# Patient Record
Sex: Female | Born: 1955 | Race: White | Hispanic: No | Marital: Married | Smoking: Current every day smoker
Health system: Southern US, Community
[De-identification: ages and names within clinical notes are randomized; demographics above are authoritative.]

## PROBLEM LIST (undated history)

## (undated) DIAGNOSIS — J310 Chronic rhinitis: Secondary | ICD-10-CM

## (undated) DIAGNOSIS — D649 Anemia, unspecified: Secondary | ICD-10-CM

## (undated) DIAGNOSIS — R011 Cardiac murmur, unspecified: Secondary | ICD-10-CM

## (undated) DIAGNOSIS — R519 Headache, unspecified: Secondary | ICD-10-CM

## (undated) DIAGNOSIS — K7460 Unspecified cirrhosis of liver: Secondary | ICD-10-CM

## (undated) DIAGNOSIS — M4802 Spinal stenosis, cervical region: Secondary | ICD-10-CM

## (undated) DIAGNOSIS — I10 Essential (primary) hypertension: Secondary | ICD-10-CM

## (undated) HISTORY — PX: CATARACT EXTRACTION: SUR2

## (undated) HISTORY — PX: TUBAL LIGATION: SHX77

## (undated) HISTORY — PX: LIVER BIOPSY: SHX301

## (undated) HISTORY — DX: Essential (primary) hypertension: I10

## (undated) HISTORY — DX: Chronic rhinitis: J31.0

---

## 1999-02-22 ENCOUNTER — Emergency Department (HOSPITAL_COMMUNITY): Admission: EM | Admit: 1999-02-22 | Discharge: 1999-02-22 | Payer: Self-pay | Admitting: Emergency Medicine

## 2004-06-26 ENCOUNTER — Ambulatory Visit: Payer: Self-pay | Admitting: Neurosurgery

## 2005-08-05 ENCOUNTER — Emergency Department: Payer: Self-pay | Admitting: Emergency Medicine

## 2007-01-13 ENCOUNTER — Other Ambulatory Visit: Payer: Self-pay

## 2007-01-13 ENCOUNTER — Emergency Department: Payer: Self-pay | Admitting: Emergency Medicine

## 2007-07-19 ENCOUNTER — Emergency Department: Payer: Self-pay | Admitting: Internal Medicine

## 2016-02-01 ENCOUNTER — Emergency Department: Payer: Medicare Other

## 2016-02-01 ENCOUNTER — Emergency Department
Admission: EM | Admit: 2016-02-01 | Discharge: 2016-02-01 | Disposition: A | Discharge status: Home / Self Care | Payer: Medicare Other | Attending: Emergency Medicine | Admitting: Emergency Medicine

## 2016-02-01 ENCOUNTER — Encounter: Payer: Self-pay | Admitting: Emergency Medicine

## 2016-02-01 DIAGNOSIS — J0100 Acute maxillary sinusitis, unspecified: Secondary | ICD-10-CM | POA: Insufficient documentation

## 2016-02-01 DIAGNOSIS — F17210 Nicotine dependence, cigarettes, uncomplicated: Secondary | ICD-10-CM | POA: Diagnosis not present

## 2016-02-01 DIAGNOSIS — R0981 Nasal congestion: Secondary | ICD-10-CM | POA: Diagnosis present

## 2016-02-01 HISTORY — DX: Spinal stenosis, cervical region: M48.02

## 2016-02-01 MED ORDER — BENZONATATE 100 MG PO CAPS: 100.0000 mg | ORAL_CAPSULE | Freq: Three times a day (TID) | ORAL | Status: DC | PRN

## 2016-02-01 MED ORDER — IPRATROPIUM-ALBUTEROL 0.5-2.5 (3) MG/3ML IN SOLN
3.0000 mL | Freq: Once | RESPIRATORY_TRACT | Status: DC
  Filled 2016-02-01: qty 3

## 2016-02-01 MED ORDER — AMOXICILLIN 500 MG PO CAPS: 500.0000 mg | ORAL_CAPSULE | Freq: Three times a day (TID) | ORAL | Status: DC

## 2016-02-01 MED ORDER — AMOXICILLIN 500 MG PO CAPS
500.0000 mg | ORAL_CAPSULE | Freq: Once | ORAL | Status: AC
  Administered 2016-02-01: 500 mg via ORAL
  Filled 2016-02-01: qty 1

## 2016-02-01 MED ORDER — FLUTICASONE PROPIONATE 50 MCG/ACT NA SUSP: 2.0000 | Freq: Every day | NASAL | Status: DC

## 2016-02-01 MED ORDER — ALBUTEROL SULFATE HFA 108 (90 BASE) MCG/ACT IN AERS: 2.0000 | INHALATION_SPRAY | Freq: Four times a day (QID) | RESPIRATORY_TRACT | Status: DC | PRN

## 2016-02-01 NOTE — Discharge Instructions (Signed)

## 2016-02-01 NOTE — ED Notes (Signed)
Patient with cough and congestion for 2 weeks. Nasal drainage is yellow/green and sputum is yellow per patient.

## 2016-02-01 NOTE — ED Notes (Signed)
Discharge instructions reviewed with patient. Patient verbalized understanding. Patient ambulated to lobby without difficulty.   

## 2016-02-01 NOTE — ED Provider Notes (Signed)
[facility] Emergency Department Provider Note ____________________________________________  Time seen: 2209  I have reviewed the triage vital signs and the nursing notes.  HISTORY  Chief Complaint  Nasal Congestion  HPI Monica Johnson is a 60 y.o. female presents to the ED for evaluation of cough and nasal congestion for the last 2 weeks. Patient describes symptoms initially with clear nasal drainage that quickly progressed to purulent nasal drainage and stuffy nose. She also developed some chest congestion intermittently productive cough. She also reports of ectopy was in the interim. She has been treating her symptoms with a daily sinus wash as well as sporadic doses of NyQuil. She denies any significant headache, nausea, vomiting, or vertigo.  Past Medical History  Diagnosis Date  . Cervical stenosis of spine     There are no active problems to display for this patient.   Past Surgical History  Procedure Laterality Date  . Eye surgery      Current Outpatient Rx  Name  Route  Sig  Dispense  Refill  . albuterol (PROVENTIL HFA;VENTOLIN HFA) 108 (90 Base) MCG/ACT inhaler   Inhalation   Inhale 2 puffs into the lungs every 6 (six) hours as needed for wheezing or shortness of breath.   1 Inhaler   0   . amoxicillin (AMOXIL) 500 MG capsule   Oral   Take 1 capsule (500 mg total) by mouth 3 (three) times daily.   30 capsule   0   . benzonatate (TESSALON PERLES) 100 MG capsule   Oral   Take 1 capsule (100 mg total) by mouth 3 (three) times daily as needed for cough (Take 1-2 per dose).   30 capsule   0   . fluticasone (FLONASE) 50 MCG/ACT nasal spray   Each Nare   Place 2 sprays into both nostrils daily.   16 g   0     Allergies Aspirin and Sulfa antibiotics  No family history on file.  Social History Social History  Substance Use Topics  . Smoking status: Current Every Day Smoker -- 1.00 packs/day    Types: Cigarettes  . Smokeless  tobacco: Never Used  . Alcohol Use: No   Review of Systems  Constitutional: Positive for subjective fever. Eyes: Negative for visual changes. ENT: Negative for sore throat. Report purulent nasal drainage and congestion as above.  Cardiovascular: Negative for chest pain. Respiratory: Negative for shortness of breath. Reports intermittently productive cough.  Neurological: Negative for headaches, focal weakness or numbness. ____________________________________________  PHYSICAL EXAM:  VITAL SIGNS: ED Triage Vitals  Enc Vitals Group     BP 02/01/16 2101 126/56 mmHg     Pulse Rate 02/01/16 2101 85     Resp 02/01/16 2101 18     Temp 02/01/16 2101 98.1 F (36.7 C)     Temp Source 02/01/16 2101 Oral     SpO2 02/01/16 2101 96 %     Weight 02/01/16 2101 150 lb (68.04 kg)     Height 02/01/16 2101 5\' 3"  (1.6 m)     Head Cir --      Peak Flow --      Pain Score --      Pain Loc --      Pain Edu? --      Excl. in [place]? --    Constitutional: Alert and oriented. Well appearing and in no distress. Head: Normocephalic and atraumatic.      Eyes: Conjunctivae are normal. PERRL. Normal extraocular movements  Ears: Canals clear. TMs intact bilaterally.   Nose: No congestion/rhinorrhea.   Mouth/Throat: Mucous membranes are moist.   Neck: Supple. No thyromegaly. Hematological/Lymphatic/Immunological: No cervical lymphadenopathy. Cardiovascular: Normal rate, regular rhythm.  Respiratory: Normal respiratory effort. No wheezes/rales/rhonchi. Gastrointestinal: Soft and nontender. No distention. Musculoskeletal: Nontender with normal range of motion in all extremities.  Neurologic:  Normal gait without ataxia. Normal speech and language. No gross focal neurologic deficits are appreciated. Skin:  Skin is warm, dry and intact. No rash noted. ____________________________________________   RADIOLOGY  CXR IMPRESSION: No active cardiopulmonary disease.  I, Karlei Waldo, Dannielle Karvonen,  personally viewed and evaluated these images (plain radiographs) as part of my medical decision making, as well as reviewing the written report by the radiologist. ____________________________________________  PROCEDURES  Amoxicillin 875 mg PO ____________________________________________  INITIAL IMPRESSION / ASSESSMENT AND PLAN / ED COURSE  Patient with evidence consistent with an acute maxillary sinusitis. She'll be discharged with a prescription for amoxicillin 500 mg, Tessalon Perles, and instructions on over-the-counter medicine management including continued nasal rinses, antihistamine, and decongestant as needed. She will follow with her primary care provider for ongoing symptom management. ____________________________________________  FINAL CLINICAL IMPRESSION(S) / ED DIAGNOSES  Final diagnoses:  Acute maxillary sinusitis, recurrence not specified     Melvenia Needles, PA-C 02/01/16 2255  Harvest Dark, MD 02/01/16 2317

## 2016-02-01 NOTE — ED Notes (Signed)
Pt presents to ED with nasal congestion since yesterday. Fatigue, cough, and intermittent fever at home.

## 2016-11-19 ENCOUNTER — Emergency Department
Admission: EM | Admit: 2016-11-19 | Discharge: 2016-11-19 | Disposition: A | Discharge status: Home / Self Care | Payer: Medicare Other | Attending: Emergency Medicine | Admitting: Emergency Medicine

## 2016-11-19 ENCOUNTER — Emergency Department: Payer: Medicare Other

## 2016-11-19 DIAGNOSIS — R109 Unspecified abdominal pain: Secondary | ICD-10-CM | POA: Diagnosis present

## 2016-11-19 DIAGNOSIS — N12 Tubulo-interstitial nephritis, not specified as acute or chronic: Secondary | ICD-10-CM | POA: Insufficient documentation

## 2016-11-19 DIAGNOSIS — F17210 Nicotine dependence, cigarettes, uncomplicated: Secondary | ICD-10-CM | POA: Diagnosis not present

## 2016-11-19 DIAGNOSIS — R300 Dysuria: Secondary | ICD-10-CM | POA: Diagnosis not present

## 2016-11-19 LAB — URINALYSIS, ROUTINE W REFLEX MICROSCOPIC
Bilirubin Urine: NEGATIVE
Glucose, UA: NEGATIVE mg/dL
Ketones, ur: NEGATIVE mg/dL
Nitrite: POSITIVE — AB
Protein, ur: NEGATIVE mg/dL
Specific Gravity, Urine: 1.025 (ref 1.005–1.030)
pH: 5.5 (ref 5.0–8.0)

## 2016-11-19 LAB — URINALYSIS, MICROSCOPIC (REFLEX)

## 2016-11-19 MED ORDER — NAPROXEN 500 MG PO TBEC: 500.0000 mg | DELAYED_RELEASE_TABLET | Freq: Two times a day (BID) | ORAL | 0 refills | Status: AC

## 2016-11-19 MED ORDER — KETOROLAC TROMETHAMINE 30 MG/ML IJ SOLN
30.0000 mg | Freq: Once | INTRAMUSCULAR | Status: AC
  Administered 2016-11-19: 30 mg via INTRAMUSCULAR
  Filled 2016-11-19: qty 1

## 2016-11-19 MED ORDER — LEVOFLOXACIN 750 MG PO TABS: 750.0000 mg | ORAL_TABLET | Freq: Every day | ORAL | 0 refills | Status: AC

## 2016-11-19 MED ORDER — LEVOFLOXACIN 500 MG PO TABS
750.0000 mg | ORAL_TABLET | Freq: Once | ORAL | Status: AC
  Administered 2016-11-19: 750 mg via ORAL
  Filled 2016-11-19: qty 1

## 2016-11-19 NOTE — ED Notes (Signed)
See triage note.  Developed bilateral flank pain for 3 days   Also had fever couple of nights ago.  Afebrile on arrival

## 2016-11-19 NOTE — ED Triage Notes (Signed)
Pt reports dysuria and urinary frequency for 3 days.  Pt also has low back pain.  Pt alert.

## 2016-11-19 NOTE — ED Provider Notes (Signed)
[facility] Emergency Department Provider Note  ____________________________________________  Time seen: Approximately 9:35 PM  I have reviewed the triage vital signs and the nursing notes.   HISTORY  Chief Complaint Dysuria    HPI Monica Johnson is a 61 y.o. female presenting to the emergency department with increased urinary frequency, dysuria, hematuria and bilateral flank pain for the past 3 days. Patient states that she has had fever through the night. However, she has not evaluated her temperature. Patient states that she has been diagnosed with cystitis in the past. Patient states that she has been drinking cranberry juice and using apple cider vinegar, which has not relieved her symptoms. Patient states that she is currently homeless and living in her car after her husband abandoned her. She is not currently working and is accompanied by her dog. Patient denies nausea, vomiting or abdominal pain. Patient has never been diagnosed with a kidney stone in the past.   Past Medical History:  Diagnosis Date  . Cervical stenosis of spine     There are no active problems to display for this patient.   Past Surgical History:  Procedure Laterality Date  . EYE SURGERY      Prior to Admission medications   Medication Sig Start Date End Date Taking? Authorizing Provider  albuterol (PROVENTIL HFA;VENTOLIN HFA) 108 (90 Base) MCG/ACT inhaler Inhale 2 puffs into the lungs every 6 (six) hours as needed for wheezing or shortness of breath. 02/01/16   Jenise V Bacon Menshew, PA-C  amoxicillin (AMOXIL) 500 MG capsule Take 1 capsule (500 mg total) by mouth 3 (three) times daily. 02/01/16   Jenise V Bacon Menshew, PA-C  benzonatate (TESSALON PERLES) 100 MG capsule Take 1 capsule (100 mg total) by mouth 3 (three) times daily as needed for cough (Take 1-2 per dose). 02/01/16   Jenise V Bacon Menshew, PA-C  fluticasone (FLONASE) 50 MCG/ACT nasal spray Place 2 sprays into both  nostrils daily. 02/01/16   Jenise V Bacon Menshew, PA-C  levofloxacin (LEVAQUIN) 750 MG tablet Take 1 tablet (750 mg total) by mouth daily. 11/19/16 11/25/16  Lannie Fields, PA-C  naproxen (EC NAPROSYN) 500 MG EC tablet Take 1 tablet (500 mg total) by mouth 2 (two) times daily with a meal. 11/19/16 11/29/16  Lannie Fields, PA-C    Allergies Aspirin and Sulfa antibiotics  No family history on file.  Social History Social History  Substance Use Topics  . Smoking status: Current Every Day Smoker    Packs/day: 1.00    Types: Cigarettes  . Smokeless tobacco: Never Used  . Alcohol use No     Review of Systems  Constitutional: Patient has had fever.  Eyes: No visual changes. No discharge ENT: No upper respiratory complaints. Cardiovascular: no chest pain. Respiratory: no cough. No SOB. Gastrointestinal: No abdominal pain.  No nausea, no vomiting.  No diarrhea.  No constipation. Genitourinary: Patient has dysuria, increased urinary frequency and hematuria. Patient has bilateral flank pain. Musculoskeletal: Negative for musculoskeletal pain. Skin: Negative for rash, abrasions, lacerations, ecchymosis. Neurological: Negative for headaches, focal weakness or numbness.   ____________________________________________   PHYSICAL EXAM:  VITAL SIGNS: ED Triage Vitals  Enc Vitals Group     BP 11/19/16 1819 (!) 155/64     Pulse Rate 11/19/16 1819 87     Resp 11/19/16 1819 20     Temp 11/19/16 1819 98 F (36.7 C)     Temp Source 11/19/16 1819 Oral     SpO2 11/19/16  1819 99 %     Weight 11/19/16 1821 150 lb (68 kg)     Height 11/19/16 1821 5\' 3"  (1.6 m)     Head Circumference --      Peak Flow --      Pain Score 11/19/16 1819 8     Pain Loc --      Pain Edu? --      Excl. in [place]? --     Constitutional: Alert and oriented. Well appearing and in no acute distress. Eyes: Conjunctivae are normal. PERRL. EOMI. Head: Atraumatic. Cardiovascular: Normal rate, regular rhythm. Normal S1  and S2.  Good peripheral circulation. Respiratory: Normal respiratory effort without tachypnea or retractions. Lungs CTAB. Good air entry to the bases with no decreased or absent breath sounds. Gastrointestinal: Bowel sounds 4 quadrants. Patient has suprapubic pain. No guarding or rigidity. No palpable masses. No distention. Patient has bilateral CVA tenderness.  Musculoskeletal: Full range of motion to all extremities. No gross deformities appreciated. Neurologic:  Normal speech and language. No gross focal neurologic deficits are appreciated.  Skin:  Skin is warm, dry and intact. No rash noted. Psychiatric: Mood and affect are normal. Speech and behavior are normal. Patient exhibits appropriate insight and judgement.   ____________________________________________   LABS (all labs ordered are listed, but only abnormal results are displayed)  Labs Reviewed  URINALYSIS, ROUTINE W REFLEX MICROSCOPIC - Abnormal; Notable for the following:       Result Value   APPearance CLOUDY (*)    Hgb urine dipstick LARGE (*)    Nitrite POSITIVE (*)    Leukocytes, UA LARGE (*)    All other components within normal limits  URINALYSIS, MICROSCOPIC (REFLEX) - Abnormal; Notable for the following:    Bacteria, UA MANY (*)    Squamous Epithelial / LPF 0-5 (*)    All other components within normal limits   ____________________________________________  EKG   ____________________________________________  RADIOLOGY Unk Pinto, personally viewed and evaluated these images (plain radiographs) as part of my medical decision making, as well as reviewing the written report by the radiologist.  Ct Renal Stone Study  Result Date: 11/19/2016 CLINICAL DATA:  Dysuria and urinary frequency EXAM: CT ABDOMEN AND PELVIS WITHOUT CONTRAST TECHNIQUE: Multidetector CT imaging of the abdomen and pelvis was performed following the standard protocol without IV contrast. COMPARISON:  None. FINDINGS: Lower chest: Lung  bases demonstrate no acute consolidation or pleural effusion. The heart is nonenlarged. Hepatobiliary: No focal hepatic abnormality. No calcified gallstones. No biliary dilatation. Pancreas: Unremarkable. No pancreatic ductal dilatation or surrounding inflammatory changes. Spleen: Normal in size without focal abnormality. Adrenals/Urinary Tract: Adrenal glands are within normal limits. No calcified stones in the kidneys. Mild perinephric fat stranding right greater than left with fat stranding around the renal pelvises. Both ureters are prominent however no calcified stones are seen along the course of the ureters. The bladder is thick-walled and irregular. Stomach/Bowel: Stomach is within normal limits. Appendix appears normal. No evidence of bowel wall thickening, distention, or inflammatory changes. Sigmoid colon diverticula. No acute inflammation Vascular/Lymphatic: Aortic atherosclerosis. Subcentimeter periesophageal lymph nodes. Small lymph nodes in the central mesentery. Reproductive: Uterus and bilateral adnexa are unremarkable. Other: No free air or free fluid. Mild hazy density within the central mesentery. Musculoskeletal: Degenerative changes. No acute or suspicious bone lesion IMPRESSION: 1. No hydronephrosis or ureteral stones are visualized. Irregular thick-walled appearance of the bladder suspicious for cystitis. There is mild perinephric fat stranding and mild inflammation around the renal  pelvises, findings could be secondary to pyelonephritis in the appropriate clinical setting. 2. Mild hazy infiltration of the central mesentery with several subcentimeter lymph nodes, could relate to nonspecific mesenteric inflammation or panniculitis Electronically Signed   By: Donavan Foil M.D.   On: 11/19/2016 20:38    ____________________________________________    PROCEDURES  Procedure(s) performed:    Procedures    Medications  levofloxacin (LEVAQUIN) tablet 750 mg (750 mg Oral Given  11/19/16 2049)  ketorolac (TORADOL) 30 MG/ML injection 30 mg (30 mg Intramuscular Given 11/19/16 2049)     ____________________________________________   INITIAL IMPRESSION / ASSESSMENT AND PLAN / ED COURSE  Pertinent labs & imaging results that were available during my care of the patient were reviewed by me and considered in my medical decision making (see chart for details).  Review of the [place] CSRS was performed in accordance of the [place] prior to dispensing any controlled drugs.     Assessment and plan: Pyelonephritis:  Patient presents to the emergency department with bilateral flank pain, increased urinary frequency, dysuria and hematuria for the past 3 days. CT renal stone study is consistent with pyelonephritis. Patient was given Levaquin in the emergency department. She was discharged with Levaquin. A follow-up referral was made to urology. Vital signs are reassuring at this time. All patient questions were answered.  ____________________________________________  FINAL CLINICAL IMPRESSION(S) / ED DIAGNOSES  Final diagnoses:  Pyelonephritis      NEW MEDICATIONS STARTED DURING THIS VISIT:  Discharge Medication List as of 11/19/2016  8:47 PM    START taking these medications   Details  levofloxacin (LEVAQUIN) 750 MG tablet Take 1 tablet (750 mg total) by mouth daily., Starting Mon 11/19/2016, Until Sun 11/25/2016, Print            This chart was dictated using voice recognition software/Dragon. Despite best efforts to proofread, errors can occur which can change the meaning. Any change was purely unintentional.    Lannie Fields, PA-C 11/19/16 2143    Nena Polio, MD 11/19/16 [phone]

## 2018-08-11 ENCOUNTER — Other Ambulatory Visit: Payer: Self-pay

## 2018-08-11 ENCOUNTER — Encounter: Payer: Self-pay | Admitting: Emergency Medicine

## 2018-08-11 ENCOUNTER — Emergency Department
Admission: EM | Admit: 2018-08-11 | Discharge: 2018-08-11 | Disposition: A | Discharge status: Home / Self Care | Payer: Medicare Other | Attending: Emergency Medicine | Admitting: Emergency Medicine

## 2018-08-11 DIAGNOSIS — M62838 Other muscle spasm: Secondary | ICD-10-CM | POA: Diagnosis not present

## 2018-08-11 DIAGNOSIS — F17210 Nicotine dependence, cigarettes, uncomplicated: Secondary | ICD-10-CM | POA: Insufficient documentation

## 2018-08-11 DIAGNOSIS — M436 Torticollis: Secondary | ICD-10-CM | POA: Diagnosis not present

## 2018-08-11 DIAGNOSIS — J039 Acute tonsillitis, unspecified: Secondary | ICD-10-CM | POA: Diagnosis not present

## 2018-08-11 DIAGNOSIS — M542 Cervicalgia: Secondary | ICD-10-CM | POA: Diagnosis present

## 2018-08-11 MED ORDER — HYDROCODONE-ACETAMINOPHEN 5-325 MG PO TABS
1.0000 | ORAL_TABLET | Freq: Once | ORAL | Status: AC
  Administered 2018-08-11: 1 via ORAL
  Filled 2018-08-11: qty 1

## 2018-08-11 MED ORDER — DEXAMETHASONE SODIUM PHOSPHATE 10 MG/ML IJ SOLN
10.0000 mg | Freq: Once | INTRAMUSCULAR | Status: AC
  Administered 2018-08-11: 10 mg via INTRAMUSCULAR
  Filled 2018-08-11: qty 1

## 2018-08-11 MED ORDER — METHOCARBAMOL 500 MG PO TABS: 500.0000 mg | ORAL_TABLET | Freq: Four times a day (QID) | ORAL | 0 refills | Status: DC | PRN

## 2018-08-11 MED ORDER — PREDNISONE 10 MG PO TABS: ORAL_TABLET | ORAL | 0 refills | Status: DC

## 2018-08-11 MED ORDER — METHOCARBAMOL 500 MG PO TABS
1000.0000 mg | ORAL_TABLET | Freq: Once | ORAL | Status: AC
  Administered 2018-08-11: 1000 mg via ORAL
  Filled 2018-08-11: qty 2

## 2018-08-11 MED ORDER — HYDROCODONE-ACETAMINOPHEN 5-325 MG PO TABS: 1.0000 | ORAL_TABLET | Freq: Four times a day (QID) | ORAL | 0 refills | Status: DC | PRN

## 2018-08-11 NOTE — Discharge Instructions (Addendum)
Follow-up with your primary care provider or the doctor that you see for neck problems.  You may also follow-up with [facility] acute care if any continued problems.  Your medicine was sent to your pharmacy in [place] taking them only as directed.  You may also use ice or heat to your neck as needed for discomfort.

## 2018-08-11 NOTE — ED Notes (Signed)
Reference triage note. Pt states "I have 5 messed up vertebra in my back". Pt has hx of muscle spasms in neck. Pt appears to be uncomfortable while laying in bed. Pt given pillow at this time.

## 2018-08-11 NOTE — ED Provider Notes (Signed)
[facility] Emergency Department Provider Note  ____________________________________________   First MD Initiated Contact with Patient 08/11/18 1315     (approximate)  I have reviewed the triage vital signs and the nursing notes.   HISTORY  Chief Complaint Neck Pain and Spasms   HPI Monica Johnson is a 63 y.o. female presents to the ED with complaint of chronic cervical pain with acute exacerbation.  Patient denies any recent injury.  She states that she has had muscle spasms since 07/27/2018 with some radiation down her left arm.  She has been using a neck pillow for support.  She states she is already had 3 MRIs and currently is seeing a Publishing rights manager.  Currently she rates her pain as an 8 out of 10.   Past Medical History:  Diagnosis Date  . Cervical stenosis of spine     There are no active problems to display for this patient.   Past Surgical History:  Procedure Laterality Date  . EYE SURGERY      Prior to Admission medications   Medication Sig Start Date End Date Taking? Authorizing Provider  albuterol (PROVENTIL HFA;VENTOLIN HFA) 108 (90 Base) MCG/ACT inhaler Inhale 2 puffs into the lungs every 6 (six) hours as needed for wheezing or shortness of breath. 02/01/16   Menshew, Dannielle Karvonen, PA-C  amoxicillin (AMOXIL) 500 MG capsule Take 1 capsule (500 mg total) by mouth 3 (three) times daily. 02/01/16   Menshew, Dannielle Karvonen, PA-C  benzonatate (TESSALON PERLES) 100 MG capsule Take 1 capsule (100 mg total) by mouth 3 (three) times daily as needed for cough (Take 1-2 per dose). 02/01/16   Menshew, Dannielle Karvonen, PA-C  fluticasone (FLONASE) 50 MCG/ACT nasal spray Place 2 sprays into both nostrils daily. 02/01/16   Menshew, Dannielle Karvonen, PA-C  HYDROcodone-acetaminophen (NORCO/VICODIN) 5-325 MG tablet Take 1 tablet by mouth every 6 (six) hours as needed for moderate pain. 08/11/18   Johnn Hai, PA-C  methocarbamol (ROBAXIN) 500 MG tablet Take 1  tablet (500 mg total) by mouth every 6 (six) hours as needed. 08/11/18   Johnn Hai, PA-C  predniSONE (DELTASONE) 10 MG tablet Take 6 tablets  today, on day 2 take 5 tablets, day 3 take 4 tablets, day 4 take 3 tablets, day 5 take  2 tablets and 1 tablet the last day 08/11/18   Johnn Hai, PA-C    Allergies Aspirin and Sulfa antibiotics  No family history on file.  Social History Social History   Tobacco Use  . Smoking status: Current Every Day Smoker    Packs/day: 1.00    Types: Cigarettes  . Smokeless tobacco: Never Used  Substance Use Topics  . Alcohol use: No  . Drug use: No    Review of Systems Constitutional: No fever/chills Eyes: No visual changes. Cardiovascular: Denies chest pain. Respiratory: Denies shortness of breath. Musculoskeletal: Positive for chronic and acute cervical pain. Skin: Negative for rash. Neurological: Negative for headaches ___________________________________________   PHYSICAL EXAM:  VITAL SIGNS: ED Triage Vitals  Enc Vitals Group     BP 08/11/18 1253 (!) 145/78     Pulse Rate 08/11/18 1253 82     Resp 08/11/18 1253 18     Temp 08/11/18 1254 98 F (36.7 C)     Temp Source 08/11/18 1253 Oral     SpO2 08/11/18 1253 98 %     Weight 08/11/18 1254 185 lb (83.9 kg)     Height 08/11/18  1254 5\' 3"  (1.6 m)     Head Circumference --      Peak Flow --      Pain Score 08/11/18 1254 8     Pain Loc --      Pain Edu? --      Excl. in [place]? --    Constitutional: Alert and oriented. Well appearing and in no acute distress. Eyes: Conjunctivae are normal. PERRL. EOMI. Head: Atraumatic. Neck: No stridor.  No point tenderness on palpation of cervical spine posteriorly.  There is moderate soft tissue tenderness on palpation bilateral trapezius muscles and cervical muscles.  Range of motion is restricted x4 planes secondary to discomfort.  No evidence of injury on skin exam.   Cardiovascular: Normal rate, regular rhythm. Grossly normal heart  sounds.  Good peripheral circulation. Respiratory: Normal respiratory effort.  No retractions. Lungs CTAB. Neurologic:  Normal speech and language. No gross focal neurologic deficits are appreciated.  Skin:  Skin is warm, dry and intact. No rash noted. Psychiatric: Mood and affect are normal. Speech and behavior are normal.  ____________________________________________   LABS (all labs ordered are listed, but only abnormal results are displayed)  Labs Reviewed - No data to display   PROCEDURES  Procedure(s) performed: None  Procedures  Critical Care performed: No  ____________________________________________   INITIAL IMPRESSION / ASSESSMENT AND PLAN / ED COURSE  As part of my medical decision making, I reviewed the following data within the electronic MEDICAL RECORD NUMBER Notes from prior ED visits and [place] Controlled Substance Database  Patient presents to the ED with complaint of muscle spasms in her neck since 12/22-23 without history of injury.  Patient has an extensive history of neck problems and states that she has had 3 MRIs and is currently seeing a neurosurgeon.  She has been using a neck wrist pillow for added support.  Neurologically she is intact.  Patient was given Decadron 10 mg IM, Norco, and methocarbamol 1000 mg p.o. while in the ED.  She was improved prior to discharge.  She will continue medications as prescribed and make an appointment to follow-up with her neurosurgeon as soon as possible.  He was encouraged to use ice or heat to her muscles as needed for discomfort. ____________________________________________   FINAL CLINICAL IMPRESSION(S) / ED DIAGNOSES  Final diagnoses:  Torticollis, acute     ED Discharge Orders         Ordered    HYDROcodone-acetaminophen (NORCO/VICODIN) 5-325 MG tablet  Every 6 hours PRN     08/11/18 1444    predniSONE (DELTASONE) 10 MG tablet     08/11/18 1444    methocarbamol (ROBAXIN) 500 MG tablet  Every 6 hours PRN      08/11/18 1444           Note:  This document was prepared using Dragon voice recognition software and may include unintentional dictation errors.    Johnn Hai, PA-C 08/11/18 1515    Carrie Mew, MD 08/18/18 0006

## 2018-08-11 NOTE — ED Triage Notes (Signed)
2-3 days before christmas, pt states her neck began hurting, spasms down her left arm, presents with soft neck pillow intact at triage, pt states pain got too intense before she could catch it. NAD.

## 2018-11-03 ENCOUNTER — Telehealth: Payer: Self-pay

## 2018-11-03 NOTE — Telephone Encounter (Signed)
E visit scheduled. 

## 2018-11-03 NOTE — Telephone Encounter (Signed)
Patient reports she will do e-visit. Please schedule on Dr. Sharmaine Base calender and send her a link.

## 2018-11-10 ENCOUNTER — Encounter: Payer: Self-pay | Admitting: Family Medicine

## 2018-11-10 ENCOUNTER — Ambulatory Visit (INDEPENDENT_AMBULATORY_CARE_PROVIDER_SITE_OTHER): Payer: Medicare Other | Admitting: Family Medicine

## 2018-11-10 DIAGNOSIS — M4802 Spinal stenosis, cervical region: Secondary | ICD-10-CM

## 2018-11-10 DIAGNOSIS — F17210 Nicotine dependence, cigarettes, uncomplicated: Secondary | ICD-10-CM | POA: Diagnosis not present

## 2018-11-10 DIAGNOSIS — M545 Low back pain: Secondary | ICD-10-CM | POA: Diagnosis not present

## 2018-11-10 DIAGNOSIS — G8929 Other chronic pain: Secondary | ICD-10-CM | POA: Diagnosis not present

## 2018-11-10 DIAGNOSIS — F17200 Nicotine dependence, unspecified, uncomplicated: Secondary | ICD-10-CM

## 2018-11-10 MED ORDER — METHOCARBAMOL 500 MG PO TABS: 500.0000 mg | ORAL_TABLET | Freq: Three times a day (TID) | ORAL | 2 refills | Status: DC | PRN

## 2018-11-10 NOTE — Patient Instructions (Signed)
Use robaxin as a muscle relaxer as needed for spasms  Consider gabapentin or Cymbalta for chronic pain related to nerves  Duloxetine delayed-release capsules What is this medicine? DULOXETINE (doo LOX e teen) is used to treat depression, anxiety, and different types of chronic pain. This medicine may be used for other purposes; ask your health care provider or pharmacist if you have questions. COMMON BRAND NAME(S): Cymbalta, Irenka What should I tell my health care provider before I take this medicine? They need to know if you have any of these conditions: -bipolar disorder or a family history of bipolar disorder -glaucoma -kidney disease -liver disease -suicidal thoughts or a previous suicide attempt -taken medicines called MAOIs like Carbex, Eldepryl, Marplan, Nardil, and Parnate within 14 days -an unusual reaction to duloxetine, other medicines, foods, dyes, or preservatives -pregnant or trying to get pregnant -breast-feeding How should I use this medicine? Take this medicine by mouth with a glass of water. Follow the directions on the prescription label. Do not cut, crush or chew this medicine. You can take this medicine with or without food. Take your medicine at regular intervals. Do not take your medicine more often than directed. Do not stop taking this medicine suddenly except upon the advice of your doctor. Stopping this medicine too quickly may cause serious side effects or your condition may worsen. A special MedGuide will be given to you by the pharmacist with each prescription and refill. Be sure to read this information carefully each time. Talk to your pediatrician regarding the use of this medicine in children. While this drug may be prescribed for children as young as 48 years of age for selected conditions, precautions do apply. Overdosage: If you think you have taken too much of this medicine contact a poison control center or emergency room at once. NOTE: This medicine is  only for you. Do not share this medicine with others. What if I miss a dose? If you miss a dose, take it as soon as you can. If it is almost time for your next dose, take only that dose. Do not take double or extra doses. What may interact with this medicine? Do not take this medicine with any of the following medications: -desvenlafaxine -levomilnacipran -linezolid -MAOIs like Carbex, Eldepryl, Marplan, Nardil, and Parnate -methylene blue (injected into a vein) -milnacipran -thioridazine -venlafaxine This medicine may also interact with the following medications: -alcohol -amphetamines -aspirin and aspirin-like medicines -certain antibiotics like ciprofloxacin and enoxacin -certain medicines for blood pressure, heart disease, irregular heart beat -certain medicines for depression, anxiety, or psychotic disturbances -certain medicines for migraine headache like almotriptan, eletriptan, frovatriptan, naratriptan, rizatriptan, sumatriptan, zolmitriptan -certain medicines that treat or prevent blood clots like warfarin, enoxaparin, and dalteparin -cimetidine -fentanyl -lithium -NSAIDS, medicines for pain and inflammation, like ibuprofen or naproxen -phentermine -procarbazine -rasagiline -sibutramine -St. John's wort -theophylline -tramadol -tryptophan This list may not describe all possible interactions. Give your health care provider a list of all the medicines, herbs, non-prescription drugs, or dietary supplements you use. Also tell them if you smoke, drink alcohol, or use illegal drugs. Some items may interact with your medicine. What should I watch for while using this medicine? Tell your doctor if your symptoms do not get better or if they get worse. Visit your doctor or health care professional for regular checks on your progress. Because it may take several weeks to see the full effects of this medicine, it is important to continue your treatment as prescribed by your  doctor. Patients  and their families should watch out for new or worsening thoughts of suicide or depression. Also watch out for sudden changes in feelings such as feeling anxious, agitated, panicky, irritable, hostile, aggressive, impulsive, severely restless, overly excited and hyperactive, or not being able to sleep. If this happens, especially at the beginning of treatment or after a change in dose, call your health care professional. Monica Johnson may get drowsy or dizzy. Do not drive, use machinery, or do anything that needs mental alertness until you know how this medicine affects you. Do not stand or sit up quickly, especially if you are an older patient. This reduces the risk of dizzy or fainting spells. Alcohol may interfere with the effect of this medicine. Avoid alcoholic drinks. This medicine can cause an increase in blood pressure. This medicine can also cause a sudden drop in your blood pressure, which may make you feel faint and increase the chance of a fall. These effects are most common when you first start the medicine or when the dose is increased, or during use of other medicines that can cause a sudden drop in blood pressure. Check with your doctor for instructions on monitoring your blood pressure while taking this medicine. Your mouth may get dry. Chewing sugarless gum or sucking hard candy, and drinking plenty of water may help. Contact your doctor if the problem does not go away or is severe. What side effects may I notice from receiving this medicine? Side effects that you should report to your doctor or health care professional as soon as possible: -allergic reactions like skin rash, itching or hives, swelling of the face, lips, or tongue -anxious -breathing problems -confusion -changes in vision -chest pain -confusion -elevated mood, decreased need for sleep, racing thoughts, impulsive behavior -eye pain -fast, irregular heartbeat -feeling faint or lightheaded, falls -feeling  agitated, angry, or irritable -hallucination, loss of contact with reality -high blood pressure -loss of balance or coordination -palpitations -redness, blistering, peeling or loosening of the skin, including inside the mouth -restlessness, pacing, inability to keep still -seizures -stiff muscles -suicidal thoughts or other mood changes -trouble passing urine or change in the amount of urine -trouble sleeping -unusual bleeding or bruising -unusually weak or tired -vomiting -yellowing of the eyes or skin Side effects that usually do not require medical attention (report to your doctor or health care professional if they continue or are bothersome): -change in sex drive or performance -change in appetite or weight -constipation -dizziness -dry mouth -headache -increased sweating -nausea -tired This list may not describe all possible side effects. Call your doctor for medical advice about side effects. You may report side effects to FDA at 1-800-FDA-1088. Where should I keep my medicine? Keep out of the reach of children. Store at room temperature between 20 and 25 degrees C (68 to 77 degrees F). Throw away any unused medicine after the expiration date. NOTE: This sheet is a summary. It may not cover all possible information. If you have questions about this medicine, talk to your doctor, pharmacist, or health care provider.  2019 Elsevier/Gold Standard (2015-12-22 18:16:03) Gabapentin capsules or tablets What is this medicine? GABAPENTIN (GA ba pen tin) is used to control seizures in certain types of epilepsy. It is also used to treat certain types of nerve pain. This medicine may be used for other purposes; ask your health care provider or pharmacist if you have questions. COMMON BRAND NAME(S): Active-PAC with Gabapentin, Gabarone, Neurontin What should I tell my health care provider before I take  this medicine? They need to know if you have any of these conditions: -kidney  disease -suicidal thoughts, plans, or attempt; a previous suicide attempt by you or a family member -an unusual or allergic reaction to gabapentin, other medicines, foods, dyes, or preservatives -pregnant or trying to get pregnant -breast-feeding How should I use this medicine? Take this medicine by mouth with a glass of water. Follow the directions on the prescription label. You can take it with or without food. If it upsets your stomach, take it with food. Take your medicine at regular intervals. Do not take it more often than directed. Do not stop taking except on your doctor's advice. If you are directed to break the 600 or 800 mg tablets in half as part of your dose, the extra half tablet should be used for the next dose. If you have not used the extra half tablet within 28 days, it should be thrown away. A special MedGuide will be given to you by the pharmacist with each prescription and refill. Be sure to read this information carefully each time. Talk to your pediatrician regarding the use of this medicine in children. While this drug may be prescribed for children as young as 3 years for selected conditions, precautions do apply. Overdosage: If you think you have taken too much of this medicine contact a poison control center or emergency room at once. NOTE: This medicine is only for you. Do not share this medicine with others. What if I miss a dose? If you miss a dose, take it as soon as you can. If it is almost time for your next dose, take only that dose. Do not take double or extra doses. What may interact with this medicine? Do not take this medicine with any of the following medications: -other gabapentin products This medicine may also interact with the following medications: -alcohol -antacids -antihistamines for allergy, cough and cold -certain medicines for anxiety or sleep -certain medicines for depression or psychotic disturbances -homatropine;  hydrocodone -naproxen -narcotic medicines (opiates) for pain -phenothiazines like chlorpromazine, mesoridazine, prochlorperazine, thioridazine This list may not describe all possible interactions. Give your health care provider a list of all the medicines, herbs, non-prescription drugs, or dietary supplements you use. Also tell them if you smoke, drink alcohol, or use illegal drugs. Some items may interact with your medicine. What should I watch for while using this medicine? Visit your doctor or health care professional for regular checks on your progress. You may want to keep a record at home of how you feel your condition is responding to treatment. You may want to share this information with your doctor or health care professional at each visit. You should contact your doctor or health care professional if your seizures get worse or if you have any new types of seizures. Do not stop taking this medicine or any of your seizure medicines unless instructed by your doctor or health care professional. Stopping your medicine suddenly can increase your seizures or their severity. Wear a medical identification bracelet or chain if you are taking this medicine for seizures, and carry a card that lists all your medications. You may get drowsy, dizzy, or have blurred vision. Do not drive, use machinery, or do anything that needs mental alertness until you know how this medicine affects you. To reduce dizzy or fainting spells, do not sit or stand up quickly, especially if you are an older patient. Alcohol can increase drowsiness and dizziness. Avoid alcoholic drinks. Your mouth may  get dry. Chewing sugarless gum or sucking hard candy, and drinking plenty of water will help. The use of this medicine may increase the chance of suicidal thoughts or actions. Pay special attention to how you are responding while on this medicine. Any worsening of mood, or thoughts of suicide or dying should be reported to your health  care professional right away. Women who become pregnant while using this medicine may enroll in the [place] Pregnancy Registry by calling [phone]. This registry collects information about the safety of antiepileptic drug use during pregnancy. What side effects may I notice from receiving this medicine? Side effects that you should report to your doctor or health care professional as soon as possible: -allergic reactions like skin rash, itching or hives, swelling of the face, lips, or tongue -worsening of mood, thoughts or actions of suicide or dying Side effects that usually do not require medical attention (report to your doctor or health care professional if they continue or are bothersome): -constipation -difficulty walking or controlling muscle movements -dizziness -nausea -slurred speech -tiredness -tremors -weight gain This list may not describe all possible side effects. Call your doctor for medical advice about side effects. You may report side effects to FDA at 1-800-FDA-1088. Where should I keep my medicine? Keep out of reach of children. This medicine may cause accidental overdose and death if it taken by other adults, children, or pets. Mix any unused medicine with a substance like cat litter or coffee grounds. Then throw the medicine away in a sealed container like a sealed bag or a coffee can with a lid. Do not use the medicine after the expiration date. Store at room temperature between 15 and 30 degrees C (59 and 86 degrees F). NOTE: This sheet is a summary. It may not cover all possible information. If you have questions about this medicine, talk to your doctor, pharmacist, or health care provider.  2019 Elsevier/Gold Standard (2017-12-26 13:21:44)

## 2018-11-10 NOTE — Progress Notes (Addendum)
Patient: Monica Johnson, Female    DOB: 03/18/1956, 63 y.o.   MRN: [id number] Visit Date: 11/12/2018  Today's Provider: Lavon Paganini, MD   Chief Complaint  Patient presents with  . Establish Care   Subjective:    Virtual Visit via Video Note  I connected with Monica Johnson on 11/12/18 at  2:00 PM EDT by a video enabled telemedicine application and verified that I am speaking with the correct person using two identifiers.   I discussed the limitations of evaluation and management by telemedicine and the availability of in person appointments. The patient expressed understanding and agreed to proceed.   Patient location: in vehicle Provider location: home office  Persons involved in the visit: patient, provider    New Patient:  Monica Johnson is a 63 y.o. female who presents today to Establish Care. She feels fairly well. She reports exercising rarely due to neck and back pain. She reports she is sleeping fairly well.  ----------------------------------------------------------------- Patient states she has no concerns or medical problems other than chronic low back pain and neck pain.  States these of been getting worse over many years.  She has been on disability for 18 years due to her neck pain that was diagnosed as cervical stenosis.  She states that in 2001 she started having pain in her neck and down her arm.  She was seen in the emergency department and had an MRI that showed stenosis.  She was evaluated by neurology in [place] who reviewed the MRI and did a nerve conduction study.  She states she will never have a nerve conduction study again as it hurts so bad.  She tried physical therapy and felt like this made her worse and she had more hand tingling afterward.  She does have intermittent weakness and tingling in her hands with certain movements mostly on the left side.  She also states that her fingers will go numb when she is reading a book or holding a steering  wheel.  She cannot lift more than 5 to 10 pounds consistently.  She does try to do some exercise, but cannot do any resistance at all as this worsens it.  Her low back pain has not been going on as long, but is still chronic.  It feels as though it is worsening.  She had a significant episode of torticollis and low back spasms in 08/2018.  She was seen in the emergency department as she did not have a PCP.  She was prescribed Robaxin and Norco which helped, but she is not on them anymore.  She is wary of taking any medication as her 2 previous PCPs (Bliss and Story) prescribed her chronic opioids and she does not want to take this.  She uses a heating pad which helps her spasms significantly, but they tightened back up after she is off of the heating pad.  She is worried that she may be using heating pad too much.  She also practices biofeedback.  She feels like that she can usually keep this at a "mind over matter level."  Patient has ongoing tobacco use disorder.  She has smoked 1 pack/day for about 39 years.  Not currently interested in quitting.  States that she knows the risks of ongoing use.   Review of Systems  Constitutional: Negative.   HENT: Negative.   Eyes: Negative.   Respiratory: Negative.   Cardiovascular: Negative.   Gastrointestinal: Negative.   Endocrine: Negative.  Genitourinary: Negative.   Musculoskeletal: Negative.   Skin: Negative.   Allergic/Immunologic: Negative.   Neurological: Negative.   Hematological: Negative.   Psychiatric/Behavioral: Negative.     Social History      She  reports that she has been smoking cigarettes. She has a 39.00 pack-year smoking history. She has never used smokeless tobacco. She reports that she does not drink alcohol or use drugs.       Social History   Socioeconomic History  . Marital status: Legally Separated    Spouse name: Not on file  . Number of children: Not on file  . Years of education: Not on file  . Highest  education level: Not on file  Occupational History  . Occupation: disabled  Social Needs  . Financial resource strain: Not on file  . Food insecurity:    Worry: Not on file    Inability: Not on file  . Transportation needs:    Medical: Not on file    Non-medical: Not on file  Tobacco Use  . Smoking status: Current Every Day Smoker    Packs/day: 1.00    Years: 39.00    Pack years: 39.00    Types: Cigarettes  . Smokeless tobacco: Never Used  Substance and Sexual Activity  . Alcohol use: No    Comment: rare use  . Drug use: No  . Sexual activity: Not on file  Lifestyle  . Physical activity:    Days per week: Not on file    Minutes per session: Not on file  . Stress: Not on file  Relationships  . Social connections:    Talks on phone: Not on file    Gets together: Not on file    Attends religious service: Not on file    Active member of club or organization: Not on file    Attends meetings of clubs or organizations: Not on file    Relationship status: Not on file  Other Topics Concern  . Not on file  Social History Narrative  . Not on file    Past Medical History:  Diagnosis Date  . Cervical stenosis of spine      Patient Active Problem List   Diagnosis Date Noted  . Cervical stenosis of spine 11/12/2018  . Chronic bilateral low back pain without sciatica 11/12/2018  . Tobacco use disorder 11/12/2018    Past Surgical History:  Procedure Laterality Date  . CATARACT EXTRACTION      Family History        Family Status  Relation Name Status  . Mother  (Not Specified)  . Monica Johnson  (Not Specified)        Her family history includes Cancer in her paternal aunt; Diabetes Mellitus II in her mother; Heart failure in her mother; Hypertension in her mother; Uterine cancer in her mother.      Allergies  Allergen Reactions  . Aspirin Anxiety and Palpitations  . Sulfa Antibiotics Anxiety     Current Outpatient Medications:  .  methocarbamol (ROBAXIN) 500 MG  tablet, Take 1 tablet (500 mg total) by mouth every 8 (eight) hours as needed., Disp: 30 tablet, Rfl: 2   Patient Care Team: System, Pcp Not In as PCP - General    Objective:    Vitals: There were no vitals taken for this visit.  There were no vitals filed for this visit.   Physical Exam Constitutional:      Appearance: Normal appearance.  HENT:  Head: Normocephalic and atraumatic.  Eyes:     General: No scleral icterus.    Conjunctiva/sclera: Conjunctivae normal.  Pulmonary:     Effort: Pulmonary effort is normal. No respiratory distress.  Neurological:     Mental Status: She is alert and oriented to person, place, and time.  Psychiatric:        Mood and Affect: Mood normal.        Behavior: Behavior normal.      Depression Screen PHQ 2/9 Scores 11/10/2018  PHQ - 2 Score 0       Assessment & Plan:   I discussed the assessment and treatment plan with the patient. The patient was provided an opportunity to ask questions and all were answered. The patient agreed with the plan and demonstrated an understanding of the instructions.   The patient was advised to call back or seek an in-person evaluation if the symptoms worsen or if the condition fails to improve as anticipated.  I provided 45 minutes of non-face-to-face time during this encounter.    Establish Care  Exercise Activities and Dietary recommendations Goals   None      There is no immunization history on file for this patient.  Health Maintenance  Topic Date Due  . Hepatitis C Screening  1956/03/29  . HIV Screening  12/04/1968  . TETANUS/TDAP  12/04/1972  . PAP SMEAR-Modifier  12/05/1974  . MAMMOGRAM  12/05/2003  . COLONOSCOPY  12/05/2003  . INFLUENZA VACCINE  03/07/2019     Discussed health benefits of physical activity, and encouraged her to engage in regular exercise appropriate for her age and condition.    Will mail AVS and ROI to patient and she will drop ROI back by the office when  able to.  Plan to initiate health maintenance screenings at next visit (CPE) --------------------------------------------------------------------  Problem List Items Addressed This Visit      Other   Cervical stenosis of spine - Primary    Chronic and not well controlled Recent episode of torticollis Intermittent radicular symptoms in her arms, mostly on the left side She does describe some hand tingling with certain positions that may also be consistent with carpal tunnel syndrome We will request records from neurologist in [place] to get nerve conduction study and MRI results Discussed option of gabapentin or Cymbalta to take chronically for her chronic pain Discussed that these are not habit-forming and have significantly less side effects than chronic opioids Can use Robaxin as needed for spasms Discussed red flag symptoms and return precautions At next visit, she will let me know if she wants to take gabapentin or Cymbalta She is wary of medications and would like to hold off on these at this point      Chronic bilateral low back pain without sciatica    Chronic and intermittent Not well controlled By description, it seems to be muscular in origin, primarily spasms No radicular symptoms Discussed that gabapentin or Cymbalta may help with this as well as above Can use Robaxin as needed Okay to use heating pad as needed Discussed that yoga or massage can be helpful at times Discussed return precautions      Relevant Medications   methocarbamol (ROBAXIN) 500 MG tablet   Tobacco use disorder    Discussed the importance of cessation and the risks of ongoing smoking Patient is pre-contemplative and not interested in quitting at this time We will reassess at next visit          Return in  about 3 months (around 02/09/2019) for physical.   The entirety of the information documented in the History of Present Illness, Review of Systems and Physical Exam were personally  obtained by me. Portions of this information were initially documented by [name], CMA and reviewed by me for thoroughness and accuracy.    Monica Crews, MD, MPH [facility] 11/12/2018 9:38 AM

## 2018-11-12 ENCOUNTER — Encounter: Payer: Self-pay | Admitting: Family Medicine

## 2018-11-12 DIAGNOSIS — M4802 Spinal stenosis, cervical region: Secondary | ICD-10-CM | POA: Insufficient documentation

## 2018-11-12 DIAGNOSIS — F17200 Nicotine dependence, unspecified, uncomplicated: Secondary | ICD-10-CM | POA: Insufficient documentation

## 2018-11-12 DIAGNOSIS — M545 Low back pain, unspecified: Secondary | ICD-10-CM | POA: Insufficient documentation

## 2018-11-12 DIAGNOSIS — G8929 Other chronic pain: Secondary | ICD-10-CM | POA: Insufficient documentation

## 2018-11-12 NOTE — Assessment & Plan Note (Signed)
Chronic and not well controlled Recent episode of torticollis Intermittent radicular symptoms in her arms, mostly on the left side She does describe some hand tingling with certain positions that may also be consistent with carpal tunnel syndrome We will request records from neurologist in [place] to get nerve conduction study and MRI results Discussed option of gabapentin or Cymbalta to take chronically for her chronic pain Discussed that these are not habit-forming and have significantly less side effects than chronic opioids Can use Robaxin as needed for spasms Discussed red flag symptoms and return precautions At next visit, she will let me know if she wants to take gabapentin or Cymbalta She is wary of medications and would like to hold off on these at this point

## 2018-11-12 NOTE — Assessment & Plan Note (Signed)
Discussed the importance of cessation and the risks of ongoing smoking Patient is pre-contemplative and not interested in quitting at this time We will reassess at next visit

## 2018-11-12 NOTE — Assessment & Plan Note (Signed)
Chronic and intermittent Not well controlled By description, it seems to be muscular in origin, primarily spasms No radicular symptoms Discussed that gabapentin or Cymbalta may help with this as well as above Can use Robaxin as needed Okay to use heating pad as needed Discussed that yoga or massage can be helpful at times Discussed return precautions

## 2019-05-18 ENCOUNTER — Ambulatory Visit (INDEPENDENT_AMBULATORY_CARE_PROVIDER_SITE_OTHER): Payer: Medicare Other | Admitting: Family Medicine

## 2019-05-18 ENCOUNTER — Other Ambulatory Visit: Payer: Self-pay

## 2019-05-18 ENCOUNTER — Encounter: Payer: Self-pay | Admitting: Family Medicine

## 2019-05-18 VITALS — BP 113/66 | HR 72 | Temp 97.3°F | Wt 182.6 lb

## 2019-05-18 DIAGNOSIS — Z1159 Encounter for screening for other viral diseases: Secondary | ICD-10-CM | POA: Diagnosis not present

## 2019-05-18 DIAGNOSIS — R1011 Right upper quadrant pain: Secondary | ICD-10-CM

## 2019-05-18 DIAGNOSIS — Z114 Encounter for screening for human immunodeficiency virus [HIV]: Secondary | ICD-10-CM | POA: Diagnosis not present

## 2019-05-18 DIAGNOSIS — G8929 Other chronic pain: Secondary | ICD-10-CM | POA: Diagnosis not present

## 2019-05-18 DIAGNOSIS — M545 Low back pain, unspecified: Secondary | ICD-10-CM

## 2019-05-18 DIAGNOSIS — M4802 Spinal stenosis, cervical region: Secondary | ICD-10-CM | POA: Diagnosis not present

## 2019-05-18 MED ORDER — METHOCARBAMOL 500 MG PO TABS: 500.0000 mg | ORAL_TABLET | Freq: Three times a day (TID) | ORAL | 2 refills | Status: DC | PRN

## 2019-05-18 MED ORDER — GABAPENTIN 300 MG PO CAPS: 300.0000 mg | ORAL_CAPSULE | Freq: Three times a day (TID) | ORAL | 3 refills | Status: DC

## 2019-05-18 NOTE — Patient Instructions (Signed)

## 2019-05-18 NOTE — Progress Notes (Signed)
Patient: Monica Johnson Female    DOB: [birth year]   63 y.o.   MRN: [id number] Visit Date: 05/18/2019  Today's Provider: Lavon Paganini, MD   Chief Complaint  Patient presents with  . Back Pain  . Neck Pain   Subjective:    I, [name], CMA, am acting as a scribe for Lavon Paganini, MD.    Back Pain This is a chronic problem. The current episode started more than 1 year ago. The problem occurs intermittently. The problem has been gradually worsening since onset. The pain is present in the lumbar spine. Quality: spasms. (Neck pain )  Neck Pain  This is a chronic problem. The current episode started more than 1 year ago. The problem occurs intermittently. The problem has been gradually worsening. The pain is present in the right side. The quality of the pain is described as shooting.   Her partner is a Administrator and she rides with him often.  She has worsening of pain with the jostling around on the road.  Having more pain in muscle at base of skull that radiates into a headache.  Muscle relaxer has helped  Patient reports she is previously been hesitant to take medications for this, but after talking to her partner who takes gabapentin without any side effects, she is willing to consider trying gabapentin  Patient also mentions at the end of the visit that she has been having right upper quadrant pain that mostly occurs after eating.  This has been ongoing for several months.  She denies any nausea, vomiting, diarrhea, constipation, blood in her stool, change in appetite.  She describes the pain as sharp but cramping.  She has not noticed if certain foods make this occur more than others.  She has not tried any medications or treatments for this.   Allergies  Allergen Reactions  . Aspirin Anxiety and Palpitations  . Sulfa Antibiotics Anxiety     Current Outpatient Medications:  .  methocarbamol (ROBAXIN) 500 MG tablet, Take 1 tablet (500 mg total) by mouth  every 8 (eight) hours as needed., Disp: 30 tablet, Rfl: 2  Review of Systems  Constitutional: Negative.   Respiratory: Negative.   Cardiovascular: Negative.   Musculoskeletal: Positive for back pain and neck pain.    Social History   Tobacco Use  . Smoking status: Current Every Day Smoker    Packs/day: 1.00    Years: 39.00    Pack years: 39.00    Types: Cigarettes  . Smokeless tobacco: Never Used  Substance Use Topics  . Alcohol use: No    Comment: rare use      Objective:   BP 113/66 (BP Location: Right Arm, Patient Position: Sitting, Cuff Size: Large)   Pulse 72   Temp (!) 97.3 F (36.3 C) (Temporal)   Wt 182 lb 9.6 oz (82.8 kg)   SpO2 97%   BMI 32.35 kg/m  Vitals:   05/18/19 1552  BP: 113/66  Pulse: 72  Temp: (!) 97.3 F (36.3 C)  TempSrc: Temporal  SpO2: 97%  Weight: 182 lb 9.6 oz (82.8 kg)  Body mass index is 32.35 kg/m.   Physical Exam Constitutional:      General: She is not in acute distress.    Appearance: Normal appearance.  HENT:     Head: Normocephalic and atraumatic.     Right Ear: Tympanic membrane, ear canal and external ear normal.     Left Ear: Tympanic membrane, ear  canal and external ear normal.  Eyes:     General: No scleral icterus.    Conjunctiva/sclera: Conjunctivae normal.  Neck:     Musculoskeletal: Neck supple. No neck rigidity.     Comments: Tightness of trapezius muscles and paraspinal musculature Cardiovascular:     Rate and Rhythm: Normal rate and regular rhythm.     Pulses: Normal pulses.     Heart sounds: Normal heart sounds. No murmur.  Pulmonary:     Effort: Pulmonary effort is normal. No respiratory distress.     Breath sounds: Normal breath sounds. No wheezing.  Abdominal:     General: Bowel sounds are normal. There is no distension.     Palpations: Abdomen is soft. There is no mass.     Tenderness: There is abdominal tenderness (RUQ, with +Murphy's sign).  Musculoskeletal:     Right lower leg: No edema.      Left lower leg: No edema.     Comments: Back: No midline tenderness to palpation.  Spasm and tenderness of paraspinal musculature's bilaterally.  Negative straight leg raise.  Lower extremity sensation and strength is intact.  Lymphadenopathy:     Cervical: No cervical adenopathy.  Skin:    General: Skin is warm and dry.     Capillary Refill: Capillary refill takes less than 2 seconds.     Findings: No rash.  Neurological:     General: No focal deficit present.     Mental Status: She is alert and oriented to person, place, and time. Mental status is at baseline.     Cranial Nerves: No cranial nerve deficit.     Sensory: No sensory deficit.     Motor: No weakness.     Gait: Gait normal.  Psychiatric:        Mood and Affect: Mood normal.        Behavior: Behavior normal.      No results found for any visits on 05/18/19.     Assessment & Plan   Problem List Items Addressed This Visit      Other   Cervical stenosis of spine - Primary    Chronic and not well controlled No radicular symptoms today and exam is benign Seems to mostly have muscular spasm and pain Patient is still hesitant to take Cymbalta or most other medications We can continue to use Robaxin as needed for spasms We will start gabapentin 300 mg 3 times daily At next visit, can consider dose titration Discussed return precautions      Chronic bilateral low back pain without sciatica    Chronic and not well controlled No radicular symptoms today and exam is benign Seems to mostly have muscular spasm and pain Patient is still hesitant to take Cymbalta or most other medications We can continue to use Robaxin as needed for spasms We will start gabapentin 300 mg 3 times daily At next visit, can consider dose titration Discussed return precautions      Relevant Medications   methocarbamol (ROBAXIN) 500 MG tablet   RUQ pain    New problem Patient does have tenderness on exam as well with positive Percell Miller sign,  concerning for possible gallbladder pathology Will obtain right upper quadrant ultrasound Check CMP, CBC, lipase Further management pending results Discussed return precautions      Relevant Orders   Comprehensive metabolic panel (Completed)   CBC (Completed)   Lipase (Completed)   US Abdomen Limited RUQ    Other Visit Diagnoses    Screening  for HIV (human immunodeficiency virus)       Relevant Orders   HIV antibody (with reflex) (Completed)   Need for hepatitis C screening test       Relevant Orders   Hepatitis C Antibody (Completed)       Return in about 3 months (around 08/18/2019) for chronic disease/AWV.   The entirety of the information documented in the History of Present Illness, Review of Systems and Physical Exam were personally obtained by me. Portions of this information were initially documented by [name], CMA and reviewed by me for thoroughness and accuracy.    Everley Evora, Dionne Bucy, MD MPH [facility]

## 2019-05-19 LAB — COMPREHENSIVE METABOLIC PANEL
ALT: 48 IU/L — ABNORMAL HIGH (ref 0–32)
AST: 74 IU/L — ABNORMAL HIGH (ref 0–40)
Albumin/Globulin Ratio: 1 — ABNORMAL LOW (ref 1.2–2.2)
Albumin: 3.8 g/dL (ref 3.8–4.8)
Alkaline Phosphatase: 194 IU/L — ABNORMAL HIGH (ref 39–117)
BUN/Creatinine Ratio: 11 — ABNORMAL LOW (ref 12–28)
BUN: 10 mg/dL (ref 8–27)
Bilirubin Total: 0.9 mg/dL (ref 0.0–1.2)
CO2: 24 mmol/L (ref 20–29)
Calcium: 9.7 mg/dL (ref 8.7–10.3)
Chloride: 104 mmol/L (ref 96–106)
Creatinine, Ser: 0.87 mg/dL (ref 0.57–1.00)
GFR calc Af Amer: 81 mL/min/{1.73_m2} (ref >59)
GFR calc non Af Amer: 70 mL/min/{1.73_m2} (ref >59)
Globulin, Total: 3.8 g/dL (ref 1.5–4.5)
Glucose: 88 mg/dL (ref 65–99)
Potassium: 4.2 mmol/L (ref 3.5–5.2)
Sodium: 141 mmol/L (ref 134–144)
Total Protein: 7.6 g/dL (ref 6.0–8.5)

## 2019-05-19 LAB — CBC
Hematocrit: 38.4 % (ref 34.0–46.6)
Hemoglobin: 13.3 g/dL (ref 11.1–15.9)
MCH: 32.3 pg (ref 26.6–33.0)
MCHC: 34.6 g/dL (ref 31.5–35.7)
MCV: 93 fL (ref 79–97)
Platelets: 127 10*3/uL — ABNORMAL LOW (ref 150–450)
RBC: 4.12 x10E6/uL (ref 3.77–5.28)
RDW: 13.3 % (ref 11.7–15.4)
WBC: 8.1 10*3/uL (ref 3.4–10.8)

## 2019-05-19 LAB — LIPASE: Lipase: 51 U/L (ref 14–72)

## 2019-05-19 LAB — HEPATITIS C ANTIBODY: Hep C Virus Ab: 0.2 s/co ratio (ref 0.0–0.9)

## 2019-05-19 LAB — HIV ANTIBODY (ROUTINE TESTING W REFLEX): HIV Screen 4th Generation wRfx: NONREACTIVE

## 2019-05-20 ENCOUNTER — Telehealth: Payer: Self-pay

## 2019-05-20 DIAGNOSIS — R1011 Right upper quadrant pain: Secondary | ICD-10-CM | POA: Insufficient documentation

## 2019-05-20 NOTE — Telephone Encounter (Signed)
[email or web address] 

## 2019-05-20 NOTE — Assessment & Plan Note (Signed)
New problem Patient does have tenderness on exam as well with positive Murphy sign, concerning for possible gallbladder pathology Will obtain right upper quadrant ultrasound Check CMP, CBC, lipase Further management pending results Discussed return precautions

## 2019-05-20 NOTE — Telephone Encounter (Signed)
-----   Message from Virginia Crews, MD sent at 05/19/2019  7:27 PM EDT ----- Normal labs, except elevated liver function tests and low platelets.  These are likely related.  We will learn more about your liver with the [place] that we ordered and be able to proceed from there.

## 2019-05-20 NOTE — Assessment & Plan Note (Signed)
Chronic and not well controlled No radicular symptoms today and exam is benign Seems to mostly have muscular spasm and pain Patient is still hesitant to take Cymbalta or most other medications We can continue to use Robaxin as needed for spasms We will start gabapentin 300 mg 3 times daily At next visit, can consider dose titration Discussed return precautions

## 2019-05-25 ENCOUNTER — Ambulatory Visit: Payer: Medicare Other

## 2019-05-25 NOTE — Telephone Encounter (Signed)
This is already ordered during her last visit.  We should check with the referral coordinators and see if it has been scheduled

## 2019-05-25 NOTE — Telephone Encounter (Signed)
lmtcb

## 2019-05-25 NOTE — Telephone Encounter (Signed)
Pt advised.  She agreed to have the U/S but is only able to do it on 06/15/2019.   Thanks,   -Mickel Baas

## 2019-05-26 NOTE — Telephone Encounter (Signed)
Pt was a no show for appointment on 05/25/19.Unable to reach pt.She does not have a VM set up

## 2019-06-15 ENCOUNTER — Other Ambulatory Visit: Payer: Self-pay

## 2019-06-15 ENCOUNTER — Ambulatory Visit
Admission: RE | Admit: 2019-06-15 | Discharge: 2019-06-15 | Disposition: A | Discharge status: Home / Self Care | Payer: Medicare Other | Source: Ambulatory Visit | Attending: Family Medicine | Admitting: Family Medicine

## 2019-06-15 DIAGNOSIS — R1011 Right upper quadrant pain: Secondary | ICD-10-CM | POA: Insufficient documentation

## 2019-06-15 DIAGNOSIS — K8020 Calculus of gallbladder without cholecystitis without obstruction: Secondary | ICD-10-CM | POA: Diagnosis not present

## 2019-06-17 ENCOUNTER — Telehealth: Payer: Self-pay

## 2019-06-17 NOTE — Telephone Encounter (Signed)
-----   Message from Virginia Crews, MD sent at 06/17/2019  3:00 PM EST ----- Patient has gallstones without acute infection signs of her gallbladder as well as cirrhosis on her ultrasound of her liver.  There is also a lesion in the liver that cannot be visualized well on ultrasound, so an MRI without and with contrast is recommended.  If patient is agreeable, we can go ahead and order MRI.  I also recommend that she sees GI for the cirrhosis.  She should also avoid alcohol.  Pending these results, she may also need to see a surgeon for her gallstones, but this is not emergent

## 2019-06-17 NOTE — Telephone Encounter (Signed)
Patient advised as below. Patient reports she will think about the MRI and referrals and give [place] a call when she is ready to schedule appointments. She reports that she would like to take some "natural alternative treatment" before trying anything.

## 2019-08-24 ENCOUNTER — Ambulatory Visit: Payer: Medicare Other

## 2019-08-24 ENCOUNTER — Ambulatory Visit: Payer: Medicare Other | Admitting: Family Medicine

## 2019-08-24 ENCOUNTER — Other Ambulatory Visit: Payer: Self-pay | Admitting: Family Medicine

## 2019-08-24 NOTE — Telephone Encounter (Signed)
Patient would like refill, last office visit was 05/18/19. Please advise.

## 2019-08-24 NOTE — Progress Notes (Signed)
Subjective:   Monica Johnson is a 64 y.o. female who presents for an Initial Medicare Annual Wellness Visit.    This visit is being conducted through telemedicine due to the COVID-19 pandemic. This patient has given me verbal consent via doximity to conduct this visit, patient states they are participating from their home address. Some vital signs may be absent or patient reported.    Patient identification: identified by name, DOB, and current address  Review of Systems    N/A   Cardiac Risk Factors include: advanced age (>13mn, >>75women);smoking/ tobacco exposure     Objective:    Today's Vitals   08/25/19 1122  PainSc: 0-No pain   There is no height or weight on file to calculate BMI. Unable to obtain vitals due to visit being conducted via telephonically.   Advanced Directives 08/25/2019 11/19/2016 02/01/2016  Does Patient Have a Medical Advance Directive? No No No  Would patient like information on creating a medical advance directive? No - Patient declined - -    Current Medications (verified) Outpatient Encounter Medications as of 08/25/2019  Medication Sig  . gabapentin (NEURONTIN) 300 MG capsule Take 1 capsule (300 mg total) by mouth 3 (three) times daily.  . methocarbamol (ROBAXIN) 500 MG tablet TAKE 1 TABLET (500 MG TOTAL) BY MOUTH EVERY 8 (EIGHT) HOURS AS NEEDED.  .Marland KitchenOmega-3 Fatty Acids (FISH OIL) 1000 MG CAPS Take by mouth daily.    No facility-administered encounter medications on file as of 08/25/2019.    Allergies (verified) Aspirin and Sulfa antibiotics   History: Past Medical History:  Diagnosis Date  . Cervical stenosis of spine    Past Surgical History:  Procedure Laterality Date  . CATARACT EXTRACTION     Family History  Problem Relation Age of Onset  . Diabetes Mellitus II Mother   . Heart failure Mother   . Hypertension Mother   . Uterine cancer Mother   . Cancer Paternal Aunt        unknwon type   Social History   Socioeconomic  History  . Marital status: Married    Spouse name: Not on file  . Number of children: 2  . Years of education: Not on file  . Highest education level: GED or equivalent  Occupational History  . Occupation: disabled  Tobacco Use  . Smoking status: Current Every Day Smoker    Packs/day: 1.00    Years: 39.00    Pack years: 39.00    Types: Cigarettes  . Smokeless tobacco: Never Used  Substance and Sexual Activity  . Alcohol use: Yes    Comment: a glass of wine occasionally  . Drug use: No  . Sexual activity: Yes  Other Topics Concern  . Not on file  Social History Narrative  . Not on file   Social Determinants of Health   Financial Resource Strain: Low Risk   . Difficulty of Paying Living Expenses: Not hard at all  Food Insecurity: No Food Insecurity  . Worried About RCharity fundraiserin the Last Year: Never true  . Ran Out of Food in the Last Year: Never true  Transportation Needs: No Transportation Needs  . Lack of Transportation (Medical): No  . Lack of Transportation (Non-Medical): No  Physical Activity: Inactive  . Days of Exercise per Week: 0 days  . Minutes of Exercise per Session: 0 min  Stress: No Stress Concern Present  . Feeling of Stress : Not at all  Social Connections: Somewhat  Isolated  . Frequency of Communication with Friends and Family: Three times a week  . Frequency of Social Gatherings with Friends and Family: Never  . Attends Religious Services: Never  . Active Member of Clubs or Organizations: No  . Attends Archivist Meetings: Never  . Marital Status: Married    Tobacco Counseling Ready to quit: No Counseling given: No   Clinical Intake:  Pre-visit preparation completed: Yes  Pain : No/denies pain Pain Score: 0-No pain     Nutritional Risks: None Diabetes: No  How often do you need to have someone help you when you read instructions, pamphlets, or other written materials from your doctor or pharmacy?: 1 - Never   Interpreter Needed?: No  Information entered by :: [facility], LPN   Activities of Daily Living In your present state of health, do you have any difficulty performing the following activities: 08/25/2019 11/10/2018  Hearing? N N  Vision? N N  Difficulty concentrating or making decisions? N N  Walking or climbing stairs? Y N  Comment Occasionally due to sciatica. -  Dressing or bathing? N N  Doing errands, shopping? N N  Preparing Food and eating ? N -  Using the Toilet? N -  In the past six months, have you accidently leaked urine? Y -  Comment Occasionally with urges. -  Do you have problems with loss of bowel control? N -  Managing your Medications? N -  Managing your Finances? N -  Housekeeping or managing your Housekeeping? N -  Some recent data might be hidden     Immunizations and Health Maintenance  There is no immunization history on file for this patient. Health Maintenance Due  Topic Date Due  . PAP SMEAR-Modifier  12/05/1974    Patient Care Team: Virginia Crews, MD as PCP - General (Family Medicine)  Indicate any recent Medical Services you may have received from other than [facility] providers in the past year (date may be approximate).     Assessment:   This is a routine wellness examination for Monica Johnson.  Hearing/Vision screen No exam data present  Dietary issues and exercise activities discussed: Current Exercise Habits: The patient does not participate in regular exercise at present, Exercise limited by: None identified  Goals    . Exercise 3x per week (30 min per time)     Recommend to exercise for 3 days a week for at least 30 minutes at a time.     . Quit Smoking     Recommend to continue efforts to reduce smoking habits until no longer smoking (Smoking Cessation literature attached to AVS).       Depression Screen PHQ 2/9 Scores 08/25/2019 11/10/2018  PHQ - 2 Score 0 0    Fall Risk Fall Risk  08/25/2019 11/10/2018  Falls in the past year? 0 0   Number falls in past yr: 0 -  Injury with Fall? 0 -   FALL RISK PREVENTION PERTAINING TO THE HOME:  Any stairs in or around the home? No  If so, are there any without handrails? N/A  Home free of loose throw rugs in walkways, pet beds, electrical cords, etc? Yes  Adequate lighting in your home to reduce risk of falls? Yes   ASSISTIVE DEVICES UTILIZED TO PREVENT FALLS:  Life alert? No  Use of a cane, walker or w/c? No  Grab bars in the bathroom? No  Shower chair or bench in shower? No  Elevated toilet seat or a handicapped  toilet? No    TIMED UP AND GO:  Was the test performed? No .     Cognitive Function:     6CIT Screen 08/25/2019  What Year? 0 points  What month? 0 points  What time? 0 points  Count back from 20 0 points  Months in reverse 0 points  Repeat phrase 2 points  Total Score 2    Screening Tests Health Maintenance  Topic Date Due  . PAP SMEAR-Modifier  12/05/1974  . INFLUENZA VACCINE  11/04/2019 (Originally 03/07/2019)  . MAMMOGRAM  08/24/2020 (Originally 12/05/2003)  . DEXA SCAN  08/24/2020 (Originally 12/05/2018)  . COLONOSCOPY  08/24/2020 (Originally 12/05/2003)  . TETANUS/TDAP  08/24/2020 (Originally 12/04/1972)  . PNA vac Low Risk Adult (1 of 2 - PCV13) 08/24/2020 (Originally 12/05/2018)  . Hepatitis C Screening  Completed  . HIV Screening  Completed    Qualifies for Shingles Vaccine? Yes . Due for Shingrix. Pt has been advised to call insurance company to determine out of pocket expense. Advised may also receive vaccine at local pharmacy or Health Dept. Verbalized acceptance and understanding.  Tdap: Although this vaccine is not a covered service during a Wellness Exam, does the patient still wish to receive this vaccine today?  No . Advised may receive this vaccine at local pharmacy or Health Dept. Aware to provide a copy of the vaccination record if obtained from local pharmacy or Health Dept. Verbalized acceptance and understanding.  Flu Vaccine: Due  for Flu vaccine. Does the patient want to receive this vaccine today?  No . Advised may receive this vaccine at local pharmacy or Health Dept. Aware to provide a copy of the vaccination record if obtained from local pharmacy or Health Dept. Verbalized acceptance and understanding.  Pneumococcal Vaccine: Due for Pneumococcal vaccine. Does the patient want to receive this vaccine today?  No . Advised may receive this vaccine at local pharmacy or Health Dept. Aware to provide a copy of the vaccination record if obtained from local pharmacy or Health Dept. Verbalized acceptance and understanding.   Cancer Screenings:  Colorectal Screening: Currently due. Pt declined a colonoscopy referral today. Pt ok'd the cologuard kit and would like this mailed to the [facility] [place]: Currently due. Pt declined a future order.   Bone Density: Currently due. Pt declined a referral for this at this time.    Lung Cancer Screening: (Low Dose CT Chest recommended if Age 58-80 years, 30 pack-year currently smoking OR have quit w/in 15years.) does qualify however declines order due to being out of town and traveling currently.   Additional Screening:  Hepatitis C Screening: Up to date  Vision Screening: Recommended annual ophthalmology exams for early detection of glaucoma and other disorders of the eye.  Dental Screening: Recommended annual dental exams for proper oral hygiene  Community Resource Referral:  CRR required this visit?  No       Plan:  I have personally reviewed and addressed the Medicare Annual Wellness questionnaire and have noted the following in the patient's chart:  A. Medical and social history B. Use of alcohol, tobacco or illicit drugs  C. Current medications and supplements D. Functional ability and status E.  Nutritional status F.  Physical activity G. Advance directives H. List of other physicians I.  Hospitalizations, surgeries, and ER visits in previous 12 months  J.  [place] such as hearing and vision if needed, cognitive and depression L. Referrals and appointments   In addition, I  have reviewed and discussed with patient certain preventive protocols, quality metrics, and best practice recommendations. A written personalized care plan for preventive services as well as general preventive health recommendations were provided to patient.   Glendora Score, [place]   [id number]/[id number]  Nurse Health Advisor   Nurse Notes: Pt declines future orders for all vaccines and screenings/procedures due currently.

## 2019-08-25 ENCOUNTER — Ambulatory Visit (INDEPENDENT_AMBULATORY_CARE_PROVIDER_SITE_OTHER): Payer: Medicare Other

## 2019-08-25 ENCOUNTER — Other Ambulatory Visit: Payer: Self-pay

## 2019-08-25 DIAGNOSIS — Z1211 Encounter for screening for malignant neoplasm of colon: Secondary | ICD-10-CM

## 2019-08-25 DIAGNOSIS — Z0000 Encounter for general adult medical examination without abnormal findings: Secondary | ICD-10-CM

## 2019-08-25 NOTE — Patient Instructions (Signed)
Ms. Monica Johnson , Thank you for taking time to come for your Medicare Wellness Visit. I appreciate your ongoing commitment to your health goals. Please review the following plan we discussed and let me know if I can assist you in the future.   Screening recommendations/referrals: Colonoscopy: Currently due- pt declines colonoscopy referral. Cologuard ordered today.  Mammogram: Currently due- pt declines orders today.  Bone Density: Currently due- pt declines order today.  Recommended yearly ophthalmology/optometry visit for glaucoma screening and checkup Recommended yearly dental visit for hygiene and checkup  Vaccinations: Influenza vaccine: Pt declines today.  Pneumococcal vaccine: Pt declines today.  Tdap vaccine: Pt declines today.  Shingles vaccine: Pt declines today.     Advanced directives: Advance directive discussed with you today. Even though you declined this today please call our office should you change your mind and we can give you the proper paperwork for you to fill out.  Conditions/risks identified: Smoking cessation discussed; Recommend to exercise for 3 days a week for at least 30 minutes at a time.   Next appointment: None, pt declined scheduling a follow up with PCP or an AWV for 2022 at this time.    Preventive Care 65 Years and Older, Female Preventive care refers to lifestyle choices and visits with your health care provider that can promote health and wellness. What does preventive care include?  A yearly physical exam. This is also called an annual well check.  Dental exams once or twice a year.  Routine eye exams. Ask your health care provider how often you should have your eyes checked.  Personal lifestyle choices, including:  Daily care of your teeth and gums.  Regular physical activity.  Eating a healthy diet.  Avoiding tobacco and drug use.  Limiting alcohol use.  Practicing safe sex.  Taking low-dose aspirin every day.  Taking vitamin and  mineral supplements as recommended by your health care provider. What happens during an annual well check? The services and screenings done by your health care provider during your annual well check will depend on your age, overall health, lifestyle risk factors, and family history of disease. Counseling  Your health care provider may ask you questions about your:  Alcohol use.  Tobacco use.  Drug use.  Emotional well-being.  Home and relationship well-being.  Sexual activity.  Eating habits.  History of falls.  Memory and ability to understand (cognition).  Work and work Statistician.  Reproductive health. Screening  You may have the following tests or measurements:  Height, weight, and BMI.  Blood pressure.  Lipid and cholesterol levels. These may be checked every 5 years, or more frequently if you are over 64 years old.  Skin check.  Lung cancer screening. You may have this screening every year starting at age 16 if you have a 30-pack-year history of smoking and currently smoke or have quit within the past 15 years.  Fecal occult blood test (FOBT) of the stool. You may have this test every year starting at age 1.  Flexible sigmoidoscopy or colonoscopy. You may have a sigmoidoscopy every 5 years or a colonoscopy every 10 years starting at age 53.  Hepatitis C blood test.  Hepatitis B blood test.  Sexually transmitted disease (STD) testing.  Diabetes screening. This is done by checking your blood sugar (glucose) after you have not eaten for a while (fasting). You may have this done every 1-3 years.  Bone density scan. This is done to screen for osteoporosis. You may have this done  starting at age 41.  Mammogram. This may be done every 1-2 years. Talk to your health care provider about how often you should have regular mammograms. Talk with your health care provider about your test results, treatment options, and if necessary, the need for more tests. Vaccines   Your health care provider may recommend certain vaccines, such as:  Influenza vaccine. This is recommended every year.  Tetanus, diphtheria, and acellular pertussis (Tdap, Td) vaccine. You may need a Td booster every 10 years.  Zoster vaccine. You may need this after age 44.  Pneumococcal 13-valent conjugate (PCV13) vaccine. One dose is recommended after age 57.  Pneumococcal polysaccharide (PPSV23) vaccine. One dose is recommended after age 27. Talk to your health care provider about which screenings and vaccines you need and how often you need them. This information is not intended to replace advice given to you by your health care provider. Make sure you discuss any questions you have with your health care provider. Document Released: 08/19/2015 Document Revised: 04/11/2016 Document Reviewed: 05/24/2015 Elsevier Interactive Patient Education  2017 [employer] Prevention in the Home Falls can cause injuries. They can happen to people of all ages. There are many things you can do to make your home safe and to help prevent falls. What can I do on the outside of my home?  Regularly fix the edges of walkways and driveways and fix any cracks.  Remove anything that might make you trip as you walk through a door, such as a raised step or threshold.  Trim any bushes or trees on the path to your home.  Use bright outdoor lighting.  Clear any walking paths of anything that might make someone trip, such as rocks or tools.  Regularly check to see if handrails are loose or broken. Make sure that both sides of any steps have handrails.  Any raised decks and porches should have guardrails on the edges.  Have any leaves, snow, or ice cleared regularly.  Use sand or salt on walking paths during winter.  Clean up any spills in your garage right away. This includes oil or grease spills. What can I do in the bathroom?  Use night lights.  Install grab bars by the toilet and in the  tub and shower. Do not use towel bars as grab bars.  Use non-skid mats or decals in the tub or shower.  If you need to sit down in the shower, use a plastic, non-slip stool.  Keep the floor dry. Clean up any water that spills on the floor as soon as it happens.  Remove soap buildup in the tub or shower regularly.  Attach bath mats securely with double-sided non-slip rug tape.  Do not have throw rugs and other things on the floor that can make you trip. What can I do in the bedroom?  Use night lights.  Make sure that you have a light by your bed that is easy to reach.  Do not use any sheets or blankets that are too big for your bed. They should not hang down onto the floor.  Have a firm chair that has side arms. You can use this for support while you get dressed.  Do not have throw rugs and other things on the floor that can make you trip. What can I do in the kitchen?  Clean up any spills right away.  Avoid walking on wet floors.  Keep items that you use a lot in easy-to-reach places.  If you need to reach something above you, use a strong step stool that has a grab bar.  Keep electrical cords out of the way.  Do not use floor polish or wax that makes floors slippery. If you must use wax, use non-skid floor wax.  Do not have throw rugs and other things on the floor that can make you trip. What can I do with my stairs?  Do not leave any items on the stairs.  Make sure that there are handrails on both sides of the stairs and use them. Fix handrails that are broken or loose. Make sure that handrails are as long as the stairways.  Check any carpeting to make sure that it is firmly attached to the stairs. Fix any carpet that is loose or worn.  Avoid having throw rugs at the top or bottom of the stairs. If you do have throw rugs, attach them to the floor with carpet tape.  Make sure that you have a light switch at the top of the stairs and the bottom of the stairs. If you  do not have them, ask someone to add them for you. What else can I do to help prevent falls?  Wear shoes that:  Do not have high heels.  Have rubber bottoms.  Are comfortable and fit you well.  Are closed at the toe. Do not wear sandals.  If you use a stepladder:  Make sure that it is fully opened. Do not climb a closed stepladder.  Make sure that both sides of the stepladder are locked into place.  Ask someone to hold it for you, if possible.  Clearly mark and make sure that you can see:  Any grab bars or handrails.  First and last steps.  Where the edge of each step is.  Use tools that help you move around (mobility aids) if they are needed. These include:  Canes.  Walkers.  Scooters.  Crutches.  Turn on the lights when you go into a dark area. Replace any light bulbs as soon as they burn out.  Set up your furniture so you have a clear path. Avoid moving your furniture around.  If any of your floors are uneven, fix them.  If there are any pets around you, be aware of where they are.  Review your medicines with your doctor. Some medicines can make you feel dizzy. This can increase your chance of falling. Ask your doctor what other things that you can do to help prevent falls. This information is not intended to replace advice given to you by your health care provider. Make sure you discuss any questions you have with your health care provider. Document Released: 05/19/2009 Document Revised: 12/29/2015 Document Reviewed: 08/27/2014 Elsevier Interactive Patient Education  2017 [employer].

## 2019-10-23 ENCOUNTER — Encounter: Payer: Self-pay | Admitting: Family Medicine

## 2019-10-23 ENCOUNTER — Ambulatory Visit (INDEPENDENT_AMBULATORY_CARE_PROVIDER_SITE_OTHER): Payer: Medicare Other | Admitting: Family Medicine

## 2019-10-23 DIAGNOSIS — F17200 Nicotine dependence, unspecified, uncomplicated: Secondary | ICD-10-CM | POA: Diagnosis not present

## 2019-10-23 DIAGNOSIS — G8929 Other chronic pain: Secondary | ICD-10-CM

## 2019-10-23 DIAGNOSIS — K769 Liver disease, unspecified: Secondary | ICD-10-CM | POA: Diagnosis not present

## 2019-10-23 DIAGNOSIS — K8020 Calculus of gallbladder without cholecystitis without obstruction: Secondary | ICD-10-CM | POA: Diagnosis not present

## 2019-10-23 DIAGNOSIS — M545 Low back pain: Secondary | ICD-10-CM | POA: Diagnosis not present

## 2019-10-23 DIAGNOSIS — M4802 Spinal stenosis, cervical region: Secondary | ICD-10-CM

## 2019-10-23 MED ORDER — METHOCARBAMOL 500 MG PO TABS: 500.0000 mg | ORAL_TABLET | Freq: Three times a day (TID) | ORAL | 5 refills | Status: DC | PRN

## 2019-10-23 MED ORDER — GABAPENTIN 300 MG PO CAPS: 300.0000 mg | ORAL_CAPSULE | Freq: Three times a day (TID) | ORAL | 3 refills | Status: DC

## 2019-10-23 NOTE — Assessment & Plan Note (Signed)
Chronic and well controlled Continue robaxin prn Continue gabapentin 300mg  TID - which has helped significantly

## 2019-10-23 NOTE — Assessment & Plan Note (Signed)
Advised on low fat diet No urgency or need to consider cholecystectomy at this time if she is asymptomatic as she did not have any obstruction or cholecystitis Return precautions discussed

## 2019-10-23 NOTE — Assessment & Plan Note (Signed)
Again discussed importance of cessation and risks of ongoing smoking Pre-contemplative and not interested in quitting at this time Agrees to lung cancer screening

## 2019-10-23 NOTE — Assessment & Plan Note (Signed)
Chronic and well controlled Continue robaxin prn Continue gabapentin 300mg  TID

## 2019-10-23 NOTE — Progress Notes (Signed)
Patient: Monica Johnson Female    DOB: 1955-10-22   64 y.o.   MRN: [id number] Visit Date: 10/23/2019  Today's Provider: Lavon Paganini, MD   No chief complaint on file.  Subjective:    Virtual Visit via Telephone Note  I connected with Monica Johnson on 10/23/19 at  3:40 PM EDT by telephone and verified that I am speaking with the correct person using two identifiers.  Location Patient location: home Provider location: [facility] Persons involved in the visit: patient, provider   I discussed the limitations, risks, security and privacy concerns of performing an evaluation and management service by telephone and the availability of in person appointments. I also discussed with the patient that there may be a patient responsible charge related to this service. The patient expressed understanding and agreed to proceed.  HPI  Pharmacy let patient know that she needed additional refills on gabapentin  Patient is a smoker and qualifies for lung cancer screening with low dose CT  States she does not have any pain from gallstones and does not want to see surgery.  Eating more citrus fruit because she heard this would help.  Has not discussed the L lobe liver lesion that MRI was recommended for    Allergies  Allergen Reactions  . Aspirin Anxiety and Palpitations  . Sulfa Antibiotics Anxiety     Current Outpatient Medications:  .  gabapentin (NEURONTIN) 300 MG capsule, Take 1 capsule (300 mg total) by mouth 3 (three) times daily., Disp: 90 capsule, Rfl: 3 .  methocarbamol (ROBAXIN) 500 MG tablet, TAKE 1 TABLET (500 MG TOTAL) BY MOUTH EVERY 8 (EIGHT) HOURS AS NEEDED., Disp: 60 tablet, Rfl: 2 .  Omega-3 Fatty Acids (FISH OIL) 1000 MG CAPS, Take by mouth daily. , Disp: , Rfl:   Review of Systems  Constitutional: Negative.   Respiratory: Negative.   Cardiovascular: Negative.   Gastrointestinal: Negative.   Neurological: Negative.   Psychiatric/Behavioral:  Negative.     Social History   Tobacco Use  . Smoking status: Current Every Day Smoker    Packs/day: 1.00    Years: 39.00    Pack years: 39.00    Types: Cigarettes  . Smokeless tobacco: Never Used  Substance Use Topics  . Alcohol use: Yes    Comment: a glass of wine occasionally      Objective:   There were no vitals taken for this visit. There were no vitals filed for this visit.There is no height or weight on file to calculate BMI.   Physical Exam Speaking in full sentences in NAD  No results found for any visits on 10/23/19.     Assessment & Plan    Follow Up Instructions:  I discussed the assessment and treatment plan with the patient. The patient was provided an opportunity to ask questions and all were answered. The patient agreed with the plan and demonstrated an understanding of the instructions.   The patient was advised to call back or seek an in-person evaluation if the symptoms worsen or if the condition fails to improve as anticipated.  I provided 35 minutes of non-face-to-face time during this encounter.  Problem List Items Addressed This Visit      Digestive   Calculus of gallbladder without cholecystitis without obstruction    Advised on low fat diet No urgency or need to consider cholecystectomy at this time if she is asymptomatic as she did not have any obstruction or cholecystitis Return  precautions discussed        Other   Cervical stenosis of spine    Chronic and well controlled Continue robaxin prn Continue gabapentin 300mg  TID - which has helped significantly      Chronic bilateral low back pain without sciatica    Chronic and well controlled Continue robaxin prn Continue gabapentin 300mg  TID      Relevant Medications   methocarbamol (ROBAXIN) 500 MG tablet   Tobacco use disorder - Primary    Again discussed importance of cessation and risks of ongoing smoking Pre-contemplative and not interested in quitting at this time Agrees  to lung cancer screening      Relevant Orders   CT CHEST LUNG CA SCREEN LOW DOSE W/O CM   Liver lesion, left lobe    Noted incidentally on RUQ [place] Will get MRI to evaluate further      Relevant Orders   MR LIVER W WO CONTRAST      The entirety of the information documented in the History of Present Illness, Review of Systems and Physical Exam were personally obtained by me. Portions of this information were initially documented by Ashley Royalty, CMA and reviewed by me for thoroughness and accuracy.    Tricha Ruggirello, Dionne Bucy, MD MPH [facility]

## 2019-10-23 NOTE — Assessment & Plan Note (Signed)
Noted incidentally on RUQ [place] Will get MRI to evaluate further

## 2019-10-26 ENCOUNTER — Telehealth: Payer: Self-pay | Admitting: *Deleted

## 2019-10-26 DIAGNOSIS — Z87891 Personal history of nicotine dependence: Secondary | ICD-10-CM

## 2019-10-26 NOTE — Telephone Encounter (Signed)
Attempted to contact regarding lung screening referral, however there is no answer or voicemail option.

## 2019-10-26 NOTE — Addendum Note (Signed)
Addended by: Lieutenant Diego on: 10/26/2019 11:10 AM   Modules accepted: Orders

## 2019-10-26 NOTE — Telephone Encounter (Signed)
Received referral for initial lung cancer screening scan. Contacted patient and obtained smoking history,(current, 39 pack year) as well as answering questions related to screening process. Patient denies signs of lung cancer such as weight loss or hemoptysis. Patient denies comorbidity that would prevent curative treatment if lung cancer were found. Patient is scheduled for shared decision making visit and CT scan on 11/10/19 at 130pm.

## 2019-11-05 ENCOUNTER — Ambulatory Visit: Payer: Medicare Other

## 2019-11-10 ENCOUNTER — Other Ambulatory Visit: Payer: Self-pay

## 2019-11-10 ENCOUNTER — Ambulatory Visit
Admission: RE | Admit: 2019-11-10 | Discharge: 2019-11-10 | Disposition: A | Discharge status: Home / Self Care | Payer: Medicare Other | Source: Ambulatory Visit | Attending: Nurse Practitioner | Admitting: Nurse Practitioner

## 2019-11-10 ENCOUNTER — Inpatient Hospital Stay: Payer: Medicare Other | Attending: Nurse Practitioner | Admitting: Oncology

## 2019-11-10 DIAGNOSIS — Z87891 Personal history of nicotine dependence: Secondary | ICD-10-CM | POA: Insufficient documentation

## 2019-11-10 NOTE — Progress Notes (Signed)
Virtual Visit via Video Note  I connected with [name] on 11/10/19 at  1:30 PM EDT by a video enabled telemedicine application and verified that I am speaking with the correct person using two identifiers.  Location: Patient: [facility] Provider: Office   I discussed the limitations of evaluation and management by telemedicine and the availability of in person appointments. The patient expressed understanding and agreed to proceed.  I discussed the assessment and treatment plan with the patient. The patient was provided an opportunity to ask questions and all were answered. The patient agreed with the plan and demonstrated an understanding of the instructions.   The patient was advised to call back or seek an in-person evaluation if the symptoms worsen or if the condition fails to improve as anticipated.   In accordance with CMS guidelines, patient has met eligibility criteria including age, absence of signs or symptoms of lung cancer.  Social History   Tobacco Use  . Smoking status: Current Every Day Smoker    Packs/day: 1.00    Years: 39.00    Pack years: 39.00    Types: Cigarettes  . Smokeless tobacco: Never Used  Substance Use Topics  . Alcohol use: Yes    Comment: a glass of wine occasionally  . Drug use: No      A shared decision-making session was conducted prior to the performance of CT scan. This includes one or more decision aids, includes benefits and harms of screening, follow-up diagnostic testing, over-diagnosis, false positive rate, and total radiation exposure.   Counseling on the importance of adherence to annual lung cancer LDCT screening, impact of co-morbidities, and ability or willingness to undergo diagnosis and treatment is imperative for compliance of the program.   Counseling on the importance of continued smoking cessation for former smokers; the importance of smoking cessation for current smokers, and information about tobacco cessation interventions have  been given to patient including [place] and 1800 quit [place] programs.   Written order for lung cancer screening with LDCT has been given to the patient and any and all questions have been answered to the best of my abilities.    Yearly follow up will be coordinated by Burgess Estelle, Thoracic Navigator.  I provided 15 minutes of face-to-face video visit time during this encounter, and > 50% was spent counseling as documented under my assessment & plan.   Jacquelin Hawking, NP

## 2019-11-11 ENCOUNTER — Telehealth: Payer: Self-pay | Admitting: *Deleted

## 2019-11-11 DIAGNOSIS — K7460 Unspecified cirrhosis of liver: Secondary | ICD-10-CM

## 2019-11-11 NOTE — Telephone Encounter (Signed)
Notified patient of LDCT lung cancer screening program results with recommendation for 12 month follow up imaging. Also notified of incidental findings noted below and is encouraged to discuss further with PCP (especially related to the liver and varices findings) who will receive a copy of this note and/or the CT report. Patient verbalizes understanding.   Upper Abdomen: Visualized upper abdomen is notable for advanced cirrhosis, cholelithiasis, gastroesophageal varices with splenorenal shunt, suspected splenomegaly (although incompletely visualized), and vascular calcifications.  Musculoskeletal: Degenerative changes of the visualized thoracolumbar spine.  IMPRESSION: Lung-RADS 2, benign appearance or behavior. Continue annual screening with low-dose chest CT without contrast in 12 months.  Aortic Atherosclerosis (ICD10-I70.0) and Emphysema (ICD10-J43.9).

## 2019-11-17 NOTE — Telephone Encounter (Signed)
Patient needs to go ahead with the MRI of her liver that was ordered to evaluate this further.  She may also need GI referral, which we can go ahead and place if she is agreeable.

## 2019-11-18 ENCOUNTER — Telehealth: Payer: Self-pay | Admitting: Family Medicine

## 2019-11-18 ENCOUNTER — Telehealth: Payer: Self-pay

## 2019-11-18 NOTE — Telephone Encounter (Signed)
Patient returned call after agreeing to referrals from result note yesterday. She states that she wants more information about what to avoid.  She is especially concerned about the liver. We discussed the need to avoid alcohol because of liver issues.  She also is requesting referral to dermatology for moles that have changed color.

## 2019-11-18 NOTE — Telephone Encounter (Signed)
LMTCB, PEC may advise as below.  

## 2019-11-18 NOTE — Telephone Encounter (Signed)
Pt called back and has agreed to move forward with GI referral and MRI

## 2019-11-18 NOTE — Telephone Encounter (Signed)
Copied from [place] [id number]. Topic: Medical Record Request - Other >> Nov 18, 2019  3:40 PM Erick Blinks wrote: Patient Name/DOB/MRN #: Kathi Simpers / 07-Nov-1955 / [id number] Requestor Name/Agency:  Call Back #: [phone]  Information Requested: Pt is requesting all medical records    Route to [place] for [place] clinics. For all other clinics, route to the clinic's PEC Pool.

## 2019-11-18 NOTE — Telephone Encounter (Signed)
MRI was already ordered months ago.  Just needs to be scheduled. OK to place GI referral

## 2019-11-19 NOTE — Telephone Encounter (Signed)
Can we schedule a virtual/phone visit for this maybe tomorrow?  It's a lot to explain

## 2019-11-19 NOTE — Telephone Encounter (Signed)
Referral placed.

## 2019-11-19 NOTE — Telephone Encounter (Signed)
Please advise 

## 2019-11-19 NOTE — Addendum Note (Signed)
Addended by: Shawna Orleans on: 11/19/2019 09:27 AM   Modules accepted: Orders

## 2019-11-19 NOTE — Telephone Encounter (Signed)
This is for Medical records

## 2019-11-20 NOTE — Telephone Encounter (Signed)
Tried calling; pt's voicemail is not set up.   PEC please offer pt a phone visit.   Thanks,   -Mickel Baas

## 2019-11-23 ENCOUNTER — Telehealth: Payer: Self-pay | Admitting: Family Medicine

## 2019-11-23 NOTE — Telephone Encounter (Signed)
Pt received a call regarding request for medical records.  A voice mail was left for pt to call either Nichole or Helene Kelp back regarding her request for medical records.  Thanks, [employer]

## 2020-02-25 ENCOUNTER — Other Ambulatory Visit: Payer: Self-pay | Admitting: Family Medicine

## 2020-04-29 ENCOUNTER — Encounter: Payer: Self-pay | Admitting: Family Medicine

## 2020-04-29 ENCOUNTER — Telehealth: Payer: Self-pay | Admitting: Family Medicine

## 2020-04-29 ENCOUNTER — Telehealth (INDEPENDENT_AMBULATORY_CARE_PROVIDER_SITE_OTHER): Payer: Medicare Other | Admitting: Family Medicine

## 2020-04-29 DIAGNOSIS — R188 Other ascites: Secondary | ICD-10-CM | POA: Insufficient documentation

## 2020-04-29 DIAGNOSIS — K8020 Calculus of gallbladder without cholecystitis without obstruction: Secondary | ICD-10-CM | POA: Diagnosis not present

## 2020-04-29 DIAGNOSIS — K769 Liver disease, unspecified: Secondary | ICD-10-CM

## 2020-04-29 DIAGNOSIS — K7460 Unspecified cirrhosis of liver: Secondary | ICD-10-CM | POA: Insufficient documentation

## 2020-04-29 MED ORDER — GABAPENTIN 400 MG PO CAPS: 400.0000 mg | ORAL_CAPSULE | Freq: Three times a day (TID) | ORAL | 3 refills | Status: AC

## 2020-04-29 MED ORDER — GABAPENTIN 400 MG PO CAPS: 400.0000 mg | ORAL_CAPSULE | Freq: Three times a day (TID) | ORAL | 3 refills | Status: DC

## 2020-04-29 NOTE — Assessment & Plan Note (Signed)
Noted incidentally on RUQ [place] Patient now agrees to MRI Liver to evaluate further

## 2020-04-29 NOTE — Telephone Encounter (Signed)
Would prefer if she would take benadryl prior to it because th contrast is really going to improve our visualization of the liver lesion.

## 2020-04-29 NOTE — Patient Instructions (Signed)
Cirrhosis  Cirrhosis is long-term (chronic) liver injury. The liver is the body's largest internal organ, and it performs many functions. It converts food into energy, removes toxic material from the blood, makes important proteins, and absorbs necessary vitamins from food. In cirrhosis, healthy liver cells are replaced by scar tissue. This prevents blood from flowing through the liver, making it difficult for the liver to function. Scarring of the liver cannot be reversed, but treatment can prevent it from getting worse. What are the causes? Common causes of this condition are hepatitis C and long-term alcohol abuse. Other causes include:  Nonalcoholic fatty liver disease. This happens when fat is deposited in the liver by causes other than alcohol.  Hepatitis B infection.  Autoimmune hepatitis. In this condition, the body's defense system (immune system) mistakenly attacks the liver cells, causing irritation and swelling (inflammation).  Diseases that cause blockage of ducts inside the liver.  Inherited liver diseases, such as hemochromatosis. This is one of the most common inherited liver diseases. In this disease, deposits of iron collect in the liver and other organs.  Reactions to certain long-term medicines, such as amiodarone, a heart medicine.  Parasitic infections. These include schistosomiasis, which is caused by a flatworm.  Long-term contact to certain toxins. These toxins include certain organic solvents, such as toluene and chloroform. What increases the risk? You are more likely to develop this condition if:  You have certain types of viral hepatitis.  You abuse alcohol, especially if you are female.  You are overweight.  You share needles.  You have unprotected sex with someone who has viral hepatitis. What are the signs or symptoms? You may not have any signs and symptoms at first. Symptoms may not develop until the damage to your liver starts to get worse. Early  symptoms may include:  Weakness and tiredness (fatigue).  Changes in sleep patterns or having trouble sleeping.  Itchiness.  Tenderness in the right-upper part of your abdomen.  Weight loss and muscle loss.  Nausea.  Loss of appetite.  Appearance of tiny blood vessels under the skin. Later symptoms may include:  Fatigue or weakness that is getting worse.  Yellow skin and eyes (jaundice).  Buildup of fluid in the abdomen (ascites). You may notice that your clothes are tight around your waist.  Weight gain.  Swelling of the feet and ankles (edema).  Trouble breathing.  Easy bruising and bleeding.  Vomiting blood.  Black or bloody stool.  Mental confusion. How is this diagnosed? Your health care provider may suspect cirrhosis based on your symptoms and medical history, especially if you have other medical conditions or a history of alcohol abuse. Your health care provider will do a physical exam to feel your liver and to check for signs of cirrhosis. He or she may perform other tests, including:  Blood tests to check: ? For hepatitis B or C. ? Kidney function. ? Liver function.  Imaging tests such as: ? MRI or CT scan to look for changes seen in advanced cirrhosis. ? Ultrasound to see if normal liver tissue is being replaced by scar tissue.  A procedure in which a long needle is used to take a sample of liver tissue to be checked in a lab (biopsy). Liver biopsy can confirm the diagnosis of cirrhosis. How is this treated? Treatment for this condition depends on how damaged your liver is and what caused the damage. It may include treating the symptoms of cirrhosis, or treating the underlying causes in order to   slow the damage. Treatment may include:  Making lifestyle changes, such as: ? Eating a healthy diet. You may need to work with your health care provider or a diet and nutrition specialist (dietitian) to develop an eating plan. ? Restricting salt  intake. ? Maintaining a healthy weight. ? Not abusing drugs or alcohol.  Taking medicines to: ? Treat liver infections or other infections. ? Control itching. ? Reduce fluid buildup. ? Reduce certain blood toxins. ? Reduce risk of bleeding from enlarged blood vessels in the stomach or esophagus (varices).  Liver transplant. In this procedure, a liver from a donor is used to replace your diseased liver. This is done if cirrhosis has caused liver failure. Other treatments and procedures may be done depending on the problems that you get from cirrhosis. Common problems include liver-related kidney failure (hepatorenal syndrome). Follow these instructions at home:   Take medicines only as told by your health care provider. Do not use medicines that are toxic to your liver. Ask your health care provider before taking any new medicines, including over-the-counter medicines.  Rest as needed.  Eat a well-balanced diet. Ask your health care provider or dietitian for more information.  Limit your salt or water intake, if your health care provider asks you to do this.  Do not drink alcohol. This is especially important if you are taking acetaminophen.  Keep all follow-up visits as told by your health care provider. This is important. Contact a health care provider if you:  Have fatigue or weakness that is getting worse.  Develop swelling of the hands, feet, legs, or face.  Have a fever.  Develop loss of appetite.  Have nausea or vomiting.  Develop jaundice.  Develop easy bruising or bleeding. Get help right away if you:  Vomit bright red blood or a material that looks like coffee grounds.  Have blood in your stools.  Notice that your stools appear black and tarry.  Become confused.  Have chest pain or trouble breathing. Summary  Cirrhosis is chronic liver injury. Liver damage cannot be reversed. Common causes are hepatitis C and long-term alcohol abuse.  Tests used to  diagnose cirrhosis include blood tests, imaging tests, and liver biopsy.  Treatment for this condition involves treating the underlying cause. Avoid alcohol, drugs, salt, and medicines that may damage your liver.  Contact your health care provider if you develop ascites, edema, jaundice, fever, nausea or vomiting, easy bruising or bleeding, or worsening fatigue. This information is not intended to replace advice given to you by your health care provider. Make sure you discuss any questions you have with your health care provider. Document Revised: 11/12/2018 Document Reviewed: 06/12/2017 Elsevier Patient Education  2020 [employer]vier Inc[employer]  

## 2020-04-29 NOTE — Assessment & Plan Note (Addendum)
Found on RUQ [place] in 06/2019  Declines alcohol and tylenol use MRI as below of liver Referral to GI Check CBC, lipase, and CMP given change in stools

## 2020-04-29 NOTE — Assessment & Plan Note (Signed)
Advised on low fat diet Now symptomatic and desiring cholecystectomy Referral placed for Gen Surg Discussed precautions

## 2020-04-29 NOTE — Telephone Encounter (Signed)
Pt states she does not like to do an MRI with contrast because the last one that she did caused her to have chest pressure

## 2020-04-29 NOTE — Telephone Encounter (Signed)
Tried calling; pt's voicemail is full.  PEC please advise pt as below.   Thanks,   -Mickel Baas

## 2020-04-29 NOTE — Progress Notes (Signed)
MyChart Video Visit   Virtual Visit via Video Note   This visit type was conducted due to national recommendations for restrictions regarding the COVID-19 Pandemic (e.g. social distancing) in an effort to limit this patient's exposure and mitigate transmission in our community. This patient is at least at moderate risk for complications without adequate follow up. This format is felt to be most appropriate for this patient at this time. Physical exam was limited by quality of the video and audio technology used for the visit.    Patient location: home Provider location: [place] involved in the visit: patient, provider  I discussed the limitations of evaluation and management by telemedicine and the availability of in person appointments. The patient expressed understanding and agreed to proceed.  Patient: Monica Johnson   DOB: 10/19/1955   64 y.o. Female  MRN: [id number] Visit Date: 04/29/2020  Today's healthcare provider: Lavon Paganini, MD   No chief complaint on file.  Subjective    HPI   When initially found to have gallbladder disease, she was not having any abd pain Tried a more natural method by taking citrus to "dissolve gallstones" Now with abdominal pain/distention Change in bowel movements and clay colored stools Now worried that she needs to have gallbladder removed Worse pain with high fat foods - has eliminated things that make it worse.  RUQ [place] from 06/2019 showed: IMPRESSION: 1. Cholelithiasis without sonographic evidence of acute cholecystitis. 2. Morphologic changes of cirrhosis. 3. Focal area of lobulation versus an ill-defined lesion in the left lobe. Further evaluation with MRI without and with contrast on a nonemergent basis recommended.  At the time, telephone note states that patient was going to consider MRI, but preferred more   Reports that liver was "beautiful" on [place] in 2008.  Denies alcohol and tylenol use,  except very sparingly   Social History   Tobacco Use  . Smoking status: Current Every Day Smoker    Packs/day: 1.00    Years: 39.00    Pack years: 39.00    Types: Cigarettes  . Smokeless tobacco: Never Used  Vaping Use  . Vaping Use: Never used  Substance Use Topics  . Alcohol use: Yes    Comment: a glass of wine occasionally  . Drug use: No      Medications: Outpatient Medications Prior to Visit  Medication Sig  . methocarbamol (ROBAXIN) 500 MG tablet Take 1 tablet (500 mg total) by mouth every 8 (eight) hours as needed.  . Omega-3 Fatty Acids (FISH OIL) 1000 MG CAPS Take by mouth daily.   . [DISCONTINUED] gabapentin (NEURONTIN) 300 MG capsule TAKE 1 CAPSULE BY MOUTH THREE TIMES A DAY   No facility-administered medications prior to visit.    Review of Systems  Constitutional: Negative.   Respiratory: Negative.   Cardiovascular: Negative.   Gastrointestinal: Positive for abdominal distention, abdominal pain, constipation, diarrhea and nausea. Negative for anal bleeding, blood in stool, rectal pain and vomiting.  Musculoskeletal: Negative.   Neurological: Negative.   Psychiatric/Behavioral: Negative.     Last CBC Lab Results  Component Value Date   WBC 8.1 05/18/2019   HGB 13.3 05/18/2019   HCT 38.4 05/18/2019   MCV 93 05/18/2019   MCH 32.3 05/18/2019   RDW 13.3 05/18/2019   PLT 127 (L) 59/56/3875   Last metabolic panel Lab Results  Component Value Date   GLUCOSE 88 05/18/2019   NA 141 05/18/2019   K 4.2 05/18/2019   CL 104  05/18/2019   CO2 24 05/18/2019   BUN 10 05/18/2019   CREATININE 0.87 05/18/2019   GFRNONAA 70 05/18/2019   GFRAA 81 05/18/2019   CALCIUM 9.7 05/18/2019   PROT 7.6 05/18/2019   ALBUMIN 3.8 05/18/2019   LABGLOB 3.8 05/18/2019   AGRATIO 1.0 (L) 05/18/2019   BILITOT 0.9 05/18/2019   ALKPHOS 194 (H) 05/18/2019   AST 74 (H) 05/18/2019   ALT 48 (H) 05/18/2019      Objective    There were no vitals taken for this visit. BP  Readings from Last 3 Encounters:  05/18/19 113/66  08/11/18 (!) 145/78  11/19/16 128/62   Wt Readings from Last 3 Encounters:  11/10/19 182 lb (82.6 kg)  05/18/19 182 lb 9.6 oz (82.8 kg)  08/11/18 185 lb (83.9 kg)      Physical Exam Constitutional:      General: She is not in acute distress.    Appearance: Normal appearance. She is not diaphoretic.  HENT:     Head: Normocephalic and atraumatic.  Pulmonary:     Effort: Pulmonary effort is normal. No respiratory distress.  Neurological:     Mental Status: She is alert. Mental status is at baseline.  Psychiatric:        Mood and Affect: Mood normal.        Behavior: Behavior normal.        Assessment & Plan     Problem List Items Addressed This Visit      Digestive   Calculus of gallbladder without cholecystitis without obstruction - Primary    Advised on low fat diet Now symptomatic and desiring cholecystectomy Referral placed for Gen Surg Discussed precautions      Relevant Orders   Lipase   Ambulatory referral to General Surgery   Cirrhosis of liver without ascites ([place])    Found on RUQ [place] in 06/2019  Declines alcohol and tylenol use MRI as below of liver Referral to GI Check CBC, lipase, and CMP given change in stools      Relevant Orders   Comprehensive metabolic panel   Lipase   CBC w/Diff/Platelet   Ambulatory referral to Gastroenterology     Other   Liver lesion, left lobe    Noted incidentally on RUQ [place] Patient now agrees to MRI Liver to evaluate further      Relevant Orders   MR LIVER W WO CONTRAST   Ambulatory referral to Gastroenterology       Return in about 4 months (around 08/29/2020) for AWV.     I discussed the assessment and treatment plan with the patient. The patient was provided an opportunity to ask questions and all were answered. The patient agreed with the plan and demonstrated an understanding of the instructions.   The patient was advised to call back or seek an  in-person evaluation if the symptoms worsen or if the condition fails to improve as anticipated.   I provided 40 minutes of non-face-to-face time during this encounter due to extensive counseling.   I, Lavon Paganini, MD, have reviewed all documentation for this visit. The documentation on 04/29/20 for the exam, diagnosis, procedures, and orders are all accurate and complete.   Bacigalupo, Dionne Bucy, MD, MPH [facility]

## 2020-05-17 ENCOUNTER — Ambulatory Visit
Admission: RE | Admit: 2020-05-17 | Discharge: 2020-05-17 | Disposition: A | Discharge status: Home / Self Care | Payer: Medicare Other | Source: Ambulatory Visit | Attending: Family Medicine | Admitting: Family Medicine

## 2020-05-17 ENCOUNTER — Other Ambulatory Visit: Payer: Self-pay

## 2020-05-17 DIAGNOSIS — I864 Gastric varices: Secondary | ICD-10-CM | POA: Diagnosis not present

## 2020-05-17 DIAGNOSIS — K7460 Unspecified cirrhosis of liver: Secondary | ICD-10-CM | POA: Diagnosis not present

## 2020-05-17 DIAGNOSIS — K8020 Calculus of gallbladder without cholecystitis without obstruction: Secondary | ICD-10-CM | POA: Diagnosis not present

## 2020-05-17 DIAGNOSIS — K7689 Other specified diseases of liver: Secondary | ICD-10-CM | POA: Diagnosis not present

## 2020-05-17 DIAGNOSIS — K769 Liver disease, unspecified: Secondary | ICD-10-CM | POA: Diagnosis not present

## 2020-05-17 MED ORDER — GADOBUTROL 1 MMOL/ML IV SOLN
7.5000 mL | Freq: Once | INTRAVENOUS | Status: AC | PRN
  Administered 2020-05-17: 7.5 mL via INTRAVENOUS

## 2020-05-19 ENCOUNTER — Ambulatory Visit: Payer: Self-pay | Admitting: Surgery

## 2020-05-19 ENCOUNTER — Telehealth: Payer: Self-pay

## 2020-05-19 ENCOUNTER — Other Ambulatory Visit: Payer: Self-pay

## 2020-05-19 ENCOUNTER — Telehealth: Payer: Self-pay | Admitting: Surgery

## 2020-05-19 ENCOUNTER — Ambulatory Visit (INDEPENDENT_AMBULATORY_CARE_PROVIDER_SITE_OTHER): Payer: Medicare Other | Admitting: Surgery

## 2020-05-19 ENCOUNTER — Encounter: Payer: Self-pay | Admitting: Surgery

## 2020-05-19 VITALS — BP 123/75 | HR 82 | Temp 98.4°F | Ht 62.0 in | Wt 187.2 lb

## 2020-05-19 DIAGNOSIS — K8020 Calculus of gallbladder without cholecystitis without obstruction: Secondary | ICD-10-CM | POA: Diagnosis not present

## 2020-05-19 DIAGNOSIS — K760 Fatty (change of) liver, not elsewhere classified: Secondary | ICD-10-CM

## 2020-05-19 DIAGNOSIS — K8010 Calculus of gallbladder with chronic cholecystitis without obstruction: Secondary | ICD-10-CM | POA: Diagnosis not present

## 2020-05-19 DIAGNOSIS — K7460 Unspecified cirrhosis of liver: Secondary | ICD-10-CM

## 2020-05-19 NOTE — Telephone Encounter (Signed)
-----   Message from Virginia Crews, MD sent at 05/17/2020  3:01 PM EDT ----- Cirrhosis, no liver lesion.  She definitely needs to see GI for the cirrhosis though as there are varices on the MRI as well.

## 2020-05-19 NOTE — Telephone Encounter (Signed)
Patient has been advised of Pre-Admission date/time, COVID Testing date and Surgery date.  Surgery Date: 06/01/20 Preadmission Testing Date: 05/26/20 (phone 8a-1p) Covid Testing Date: 05/30/20 - patient advised to go to the [facility] ([place]) between 8a-1p   Patient has been made aware to call [phone], between 1-3:00pm the day before surgery, to find out what time to arrive for surgery.

## 2020-05-19 NOTE — Patient Instructions (Addendum)
Have your labs drawn today at Dr Nancy Nordmann office. These were the ones ordered in September.   We will get you scheduled for a CT scan of the abdomen before surgery. We will call you about scheduling this.  We will call you with your results.

## 2020-05-19 NOTE — Progress Notes (Signed)
Patient ID: Monica Johnson, female   DOB: 05/08/1956, 64 y.o.   MRN: [id number]  Chief Complaint: Epigastric pain  History of Present Illness Monica Johnson is a 64 y.o. female with an intermittent history of epigastric pain, associated fatty food intolerance to primarily fried and spicy foods.  History of acholic stools.  Had a milkshake last night that bothered her.  She reports some frequent nausea.  She takes papaya enzyme tablets for bloating and abdominal discomfort/gas.  She had an MRI of her liver recently which confirmed hepatic cirrhosis and some varices that were gastroesophageal in location.  She had some labs a year ago that had mildly elevated AST ALT and an elevated alk phos, with a normal total bili.  She had repeat labs ordered in September which have not been obtained yet.  She denies any history of alcohol use or abuse.  She did indicate having some rounds with some pain medications secondary to her cervical stenosis for which she is disabled. She has layering gallstones without evidence of acute cholecystitis on her recent imaging.  This was about the same as her ultrasound from a year ago.  Past Medical History Past Medical History:  Diagnosis Date  . Cervical stenosis of spine   . Rhinitis, chronic       Past Surgical History:  Procedure Laterality Date  . CATARACT EXTRACTION    . TUBAL LIGATION      Allergies  Allergen Reactions  . Aspirin Anxiety and Palpitations  . Sulfa Antibiotics Anxiety    Current Outpatient Medications  Medication Sig Dispense Refill  . gabapentin (NEURONTIN) 400 MG capsule Take 1 capsule (400 mg total) by mouth 3 (three) times daily. 90 capsule 3  . methocarbamol (ROBAXIN) 500 MG tablet Take 1 tablet (500 mg total) by mouth every 8 (eight) hours as needed. 60 tablet 5  . PAPAYA ENZYME PO Take 1 tablet by mouth daily as needed.    . Omega-3 Fatty Acids (FISH OIL) 1000 MG CAPS Take by mouth daily.  (Patient not taking: Reported on 05/19/2020)      No current facility-administered medications for this visit.    Family History Family History  Problem Relation Age of Onset  . Diabetes Mellitus II Mother   . Heart failure Mother   . Hypertension Mother   . Uterine cancer Mother   . Cancer Paternal Aunt        unknwon type      Social History Social History   Tobacco Use  . Smoking status: Current Every Day Smoker    Packs/day: 0.50    Years: 39.00    Pack years: 19.50    Types: Cigarettes  . Smokeless tobacco: Never Used  Vaping Use  . Vaping Use: Never used  Substance Use Topics  . Alcohol use: Yes    Comment: a glass of wine occasionally  . Drug use: No        Review of Systems  Constitutional: Negative.   HENT: Negative.   Eyes: Negative.   Respiratory: Negative.   Cardiovascular: Negative.   Gastrointestinal: Negative.   Genitourinary: Negative.   Skin: Negative.   Neurological: Negative.   Psychiatric/Behavioral: Negative.     Physical Exam Blood pressure 123/75, pulse 82, temperature 98.4 F (36.9 C), height 5' 2" (1.575 m), weight 187 lb 3.2 oz (84.9 kg), SpO2 97 %. Last Weight  Most recent update: 05/19/2020  9:06 AM   Weight  84.9 kg (187 lb 3.2 oz)              CONSTITUTIONAL: Well developed, and nourished, appropriately responsive and aware without distress.   EYES: Sclera non-icteric.   EARS, NOSE, MOUTH AND THROAT: Mask worn under he chin.  The oropharynx is clear. Oral mucosa is pink and moist.   Hearing is intact to voice.  NECK: Trachea is midline, and there is no jugular venous distension.  LYMPH NODES:  Lymph nodes in the neck are not enlarged. RESPIRATORY:  Lungs are clear, and breath sounds are equal bilaterally. Normal respiratory effort without pathologic use of accessory muscles. CARDIOVASCULAR: Heart is regular in rate and rhythm. GI: The abdomen is soft, nontender, and nondistended.  MUSCULOSKELETAL:  Symmetrical muscle tone appreciated in all four extremities.    SKIN:  Skin turgor is normal. No pathologic skin lesions appreciated.  NEUROLOGIC:  Motor and sensation appear grossly normal.  Cranial nerves are grossly without defect. PSYCH:  Alert and oriented to person, place and time. Affect is appropriate for situation.  Data Reviewed I have personally reviewed what is currently available of the patient's imaging, recent labs and medical records.   Labs:  CBC Latest Ref Rng & Units 05/18/2019  WBC 3.4 - 10.8 x10E3/uL 8.1  Hemoglobin 11.1 - 15.9 g/dL 13.3  Hematocrit 34.0 - 46.6 % 38.4  Platelets 150 - 450 x10E3/uL 127(L)   CMP Latest Ref Rng & Units 05/18/2019  Glucose 65 - 99 mg/dL 88  BUN 8 - 27 mg/dL 10  Creatinine 0.57 - 1.00 mg/dL 0.87  Sodium 134 - 144 mmol/L 141  Potassium 3.5 - 5.2 mmol/L 4.2  Chloride 96 - 106 mmol/L 104  CO2 20 - 29 mmol/L 24  Calcium 8.7 - 10.3 mg/dL 9.7  Total Protein 6.0 - 8.5 g/dL 7.6  Total Bilirubin 0.0 - 1.2 mg/dL 0.9  Alkaline Phos 39 - 117 IU/L 194(H)  AST 0 - 40 IU/L 74(H)  ALT 0 - 32 IU/L 48(H)      Imaging: Radiology review:   CLINICAL DATA:  Cirrhosis, possible liver lesion on ultrasound  EXAM: MRI ABDOMEN WITHOUT AND WITH CONTRAST  TECHNIQUE: Multiplanar multisequence MR imaging of the abdomen was performed both before and after the administration of intravenous contrast.  CONTRAST:  7.69m GADAVIST GADOBUTROL 1 MMOL/ML IV SOLN  COMPARISON:  Right upper quadrant ultrasound dated 05/15/2019  FINDINGS: Lower chest: Lung bases are clear.  Hepatobiliary: Cirrhosis. No hepatic steatosis. Two subcentimeter T2 hyperintense nodules in the right hepatic dome (series 11/images 21 and 23), reflecting regenerating nodules. No suspicious/enhancing lesions to suggest [place] Specifically, no lesion in the left hepatic lobe to correspond to the potential sonographic abnormality. Scarring in the posterior right hepatic lobe without underlying mass lesion.  Layering small gallstones (series 4/image  22), without associated inflammatory changes. No intrahepatic or extrahepatic ductal dilatation.  Pancreas:  Within normal limits.  Spleen: Mild splenomegaly, measuring 13.6 cm maximal craniocaudal dimension.  Adrenals/Urinary Tract:  Adrenal glands are within normal limits.  Kidneys are within normal limits.  No hydronephrosis.  Stomach/Bowel: Stomach and visualized bowel are within normal limits.  Vascular/Lymphatic:  No evidence of abdominal aortic aneurysm.  Portal vein is patent. Gastroesophageal varices with dominant perigastric varices along the posterior gastric cardia.  Small upper abdominal lymph nodes, likely reactive.  Other:  No abdominal ascites.  Musculoskeletal: No focal osseous lesions.  IMPRESSION: Cirrhosis.  No findings suspicious for HCC.  Portal vein is patent. Borderline splenomegaly. Gastroesophageal varices with dominant perigastric varices along the posterior gastric cardia.  Cholelithiasis, without associated inflammatory changes.   Electronically Signed  By: Julian Hy M.D.   On: 05/17/2020 11:16 Within last 24 hrs: No results found.  Assessment    Chronic calculus cholecystitis. Gastroesophageal varices. Patient Active Problem List   Diagnosis Date Noted  . Cirrhosis of liver without ascites ([place]) 04/29/2020  . Calculus of gallbladder without cholecystitis without obstruction 10/23/2019  . Liver lesion, left lobe 10/23/2019  . Cervical stenosis of spine 11/12/2018  . Chronic bilateral low back pain without sciatica 11/12/2018  . Tobacco use disorder 11/12/2018    Plan    We discussed robotic cholecystectomy, smoking cessation, and further evaluation regarding varices and portal hypertension. Will obtain abdominal pelvic CT imaging preoperatively.  We will also check coagulation panel. We discussed the risks and benefits of proceeding with robotic cholecystectomy. The risks, benefits, potential  complications, alternative treatment options, evaluations and diagnoses, and likely outcomes were discussed in detail with the patient. The possibility of anesthetic complications, bleeding, infection, finding a normal appearing gallbladder, trauma to adjacent organs, or injury to surrounding structures, bile leak or obstruction,  the need for additional procedures, reaction to medication, pulmonary aspiration, the possible need to convert to an open procedure, and issues requiring transfusion or further operations were discussed with the patient. Questions sought, and answered to satisfaction.  No guarantees were ever spoken or implied.  The patient and/or family concurred with the proposed plan, and gave informed consent.    Face-to-face time spent with the patient and accompanying care providers(if present) was 45 minutes, with more than 50% of the time spent counseling, educating, and coordinating care of the patient.      Ronny Bacon M.D., FACS 05/19/2020, 9:59 AM

## 2020-05-19 NOTE — H&P (View-Only) (Signed)
Patient ID: Monica Johnson, female   DOB: 11/28/1955, 64 y.o.   MRN: [id number]  Chief Complaint: Epigastric pain  History of Present Illness Monica Johnson is a 64 y.o. female with an intermittent history of epigastric pain, associated fatty food intolerance to primarily fried and spicy foods.  History of acholic stools.  Had a milkshake last night that bothered her.  She reports some frequent nausea.  She takes papaya enzyme tablets for bloating and abdominal discomfort/gas.  She had an MRI of her liver recently which confirmed hepatic cirrhosis and some varices that were gastroesophageal in location.  She had some labs a year ago that had mildly elevated AST ALT and an elevated alk phos, with a normal total bili.  She had repeat labs ordered in September which have not been obtained yet.  She denies any history of alcohol use or abuse.  She did indicate having some rounds with some pain medications secondary to her cervical stenosis for which she is disabled. She has layering gallstones without evidence of acute cholecystitis on her recent imaging.  This was about the same as her ultrasound from a year ago.  Past Medical History Past Medical History:  Diagnosis Date  . Cervical stenosis of spine   . Rhinitis, chronic       Past Surgical History:  Procedure Laterality Date  . CATARACT EXTRACTION    . TUBAL LIGATION      Allergies  Allergen Reactions  . Aspirin Anxiety and Palpitations  . Sulfa Antibiotics Anxiety    Current Outpatient Medications  Medication Sig Dispense Refill  . gabapentin (NEURONTIN) 400 MG capsule Take 1 capsule (400 mg total) by mouth 3 (three) times daily. 90 capsule 3  . methocarbamol (ROBAXIN) 500 MG tablet Take 1 tablet (500 mg total) by mouth every 8 (eight) hours as needed. 60 tablet 5  . PAPAYA ENZYME PO Take 1 tablet by mouth daily as needed.    . Omega-3 Fatty Acids (FISH OIL) 1000 MG CAPS Take by mouth daily.  (Patient not taking: Reported on 05/19/2020)      No current facility-administered medications for this visit.    Family History Family History  Problem Relation Age of Onset  . Diabetes Mellitus II Mother   . Heart failure Mother   . Hypertension Mother   . Uterine cancer Mother   . Cancer Paternal Aunt        unknwon type      Social History Social History   Tobacco Use  . Smoking status: Current Every Day Smoker    Packs/day: 0.50    Years: 39.00    Pack years: 19.50    Types: Cigarettes  . Smokeless tobacco: Never Used  Vaping Use  . Vaping Use: Never used  Substance Use Topics  . Alcohol use: Yes    Comment: a glass of wine occasionally  . Drug use: No        Review of Systems  Constitutional: Negative.   HENT: Negative.   Eyes: Negative.   Respiratory: Negative.   Cardiovascular: Negative.   Gastrointestinal: Negative.   Genitourinary: Negative.   Skin: Negative.   Neurological: Negative.   Psychiatric/Behavioral: Negative.     Physical Exam Blood pressure 123/75, pulse 82, temperature 98.4 F (36.9 C), height 5' 2" (1.575 m), weight 187 lb 3.2 oz (84.9 kg), SpO2 97 %. Last Weight  Most recent update: 05/19/2020  9:06 AM   Weight  84.9 kg (187 lb 3.2 oz)              CONSTITUTIONAL: Well developed, and nourished, appropriately responsive and aware without distress.   EYES: Sclera non-icteric.   EARS, NOSE, MOUTH AND THROAT: Mask worn under he chin.  The oropharynx is clear. Oral mucosa is pink and moist.   Hearing is intact to voice.  NECK: Trachea is midline, and there is no jugular venous distension.  LYMPH NODES:  Lymph nodes in the neck are not enlarged. RESPIRATORY:  Lungs are clear, and breath sounds are equal bilaterally. Normal respiratory effort without pathologic use of accessory muscles. CARDIOVASCULAR: Heart is regular in rate and rhythm. GI: The abdomen is soft, nontender, and nondistended.  MUSCULOSKELETAL:  Symmetrical muscle tone appreciated in all four extremities.    SKIN:  Skin turgor is normal. No pathologic skin lesions appreciated.  NEUROLOGIC:  Motor and sensation appear grossly normal.  Cranial nerves are grossly without defect. PSYCH:  Alert and oriented to person, place and time. Affect is appropriate for situation.  Data Reviewed I have personally reviewed what is currently available of the patient's imaging, recent labs and medical records.   Labs:  CBC Latest Ref Rng & Units 05/18/2019  WBC 3.4 - 10.8 x10E3/uL 8.1  Hemoglobin 11.1 - 15.9 g/dL 13.3  Hematocrit 34.0 - 46.6 % 38.4  Platelets 150 - 450 x10E3/uL 127(L)   CMP Latest Ref Rng & Units 05/18/2019  Glucose 65 - 99 mg/dL 88  BUN 8 - 27 mg/dL 10  Creatinine 0.57 - 1.00 mg/dL 0.87  Sodium 134 - 144 mmol/L 141  Potassium 3.5 - 5.2 mmol/L 4.2  Chloride 96 - 106 mmol/L 104  CO2 20 - 29 mmol/L 24  Calcium 8.7 - 10.3 mg/dL 9.7  Total Protein 6.0 - 8.5 g/dL 7.6  Total Bilirubin 0.0 - 1.2 mg/dL 0.9  Alkaline Phos 39 - 117 IU/L 194(H)  AST 0 - 40 IU/L 74(H)  ALT 0 - 32 IU/L 48(H)      Imaging: Radiology review:   CLINICAL DATA:  Cirrhosis, possible liver lesion on ultrasound  EXAM: MRI ABDOMEN WITHOUT AND WITH CONTRAST  TECHNIQUE: Multiplanar multisequence MR imaging of the abdomen was performed both before and after the administration of intravenous contrast.  CONTRAST:  7.5mL GADAVIST GADOBUTROL 1 MMOL/ML IV SOLN  COMPARISON:  Right upper quadrant ultrasound dated 05/15/2019  FINDINGS: Lower chest: Lung bases are clear.  Hepatobiliary: Cirrhosis. No hepatic steatosis. Two subcentimeter T2 hyperintense nodules in the right hepatic dome (series 11/images 21 and 23), reflecting regenerating nodules. No suspicious/enhancing lesions to suggest HCC. Specifically, no lesion in the left hepatic lobe to correspond to the potential sonographic abnormality. Scarring in the posterior right hepatic lobe without underlying mass lesion.  Layering small gallstones (series 4/image  22), without associated inflammatory changes. No intrahepatic or extrahepatic ductal dilatation.  Pancreas:  Within normal limits.  Spleen: Mild splenomegaly, measuring 13.6 cm maximal craniocaudal dimension.  Adrenals/Urinary Tract:  Adrenal glands are within normal limits.  Kidneys are within normal limits.  No hydronephrosis.  Stomach/Bowel: Stomach and visualized bowel are within normal limits.  Vascular/Lymphatic:  No evidence of abdominal aortic aneurysm.  Portal vein is patent. Gastroesophageal varices with dominant perigastric varices along the posterior gastric cardia.  Small upper abdominal lymph nodes, likely reactive.  Other:  No abdominal ascites.  Musculoskeletal: No focal osseous lesions.  IMPRESSION: Cirrhosis.  No findings suspicious for HCC.  Portal vein is patent. Borderline splenomegaly. Gastroesophageal varices with dominant perigastric varices along the posterior gastric cardia.  Cholelithiasis, without associated inflammatory changes.   Electronically Signed     By: Sriyesh  Krishnan M.D.   On: 05/17/2020 11:16 Within last 24 hrs: No results found.  Assessment    Chronic calculus cholecystitis. Gastroesophageal varices. Patient Active Problem List   Diagnosis Date Noted  . Cirrhosis of liver without ascites (HCC) 04/29/2020  . Calculus of gallbladder without cholecystitis without obstruction 10/23/2019  . Liver lesion, left lobe 10/23/2019  . Cervical stenosis of spine 11/12/2018  . Chronic bilateral low back pain without sciatica 11/12/2018  . Tobacco use disorder 11/12/2018    Plan    We discussed robotic cholecystectomy, smoking cessation, and further evaluation regarding varices and portal hypertension. Will obtain abdominal pelvic CT imaging preoperatively.  We will also check coagulation panel. We discussed the risks and benefits of proceeding with robotic cholecystectomy. The risks, benefits, potential  complications, alternative treatment options, evaluations and diagnoses, and likely outcomes were discussed in detail with the patient. The possibility of anesthetic complications, bleeding, infection, finding a normal appearing gallbladder, trauma to adjacent organs, or injury to surrounding structures, bile leak or obstruction,  the need for additional procedures, reaction to medication, pulmonary aspiration, the possible need to convert to an open procedure, and issues requiring transfusion or further operations were discussed with the patient. Questions sought, and answered to satisfaction.  No guarantees were ever spoken or implied.  The patient and/or family concurred with the proposed plan, and gave informed consent.    Face-to-face time spent with the patient and accompanying care providers(if present) was 45 minutes, with more than 50% of the time spent counseling, educating, and coordinating care of the patient.      Darriana Deboy M.D., FACS 05/19/2020, 9:59 AM     

## 2020-05-19 NOTE — Telephone Encounter (Signed)
Patient has been scheduled for a CT abdomen with contrast at [facility] on 05/31/20 at 9:30 am. She is to arrive there by 9:15 am. She will need to pick up a prep kit. She will have nothing to eat or drink for 4 hours prior. Patient verbalizes understanding. We will call her with the results.

## 2020-05-19 NOTE — Telephone Encounter (Signed)
Attempted to contact patient, no answer or voicemail. Okay for PEC to advise patient.  

## 2020-05-20 LAB — COMPREHENSIVE METABOLIC PANEL
ALT: 29 IU/L (ref 0–32)
AST: 53 IU/L — ABNORMAL HIGH (ref 0–40)
Albumin/Globulin Ratio: 1 — ABNORMAL LOW (ref 1.2–2.2)
Albumin: 3.6 g/dL — ABNORMAL LOW (ref 3.8–4.8)
Alkaline Phosphatase: 147 IU/L — ABNORMAL HIGH (ref 44–121)
BUN/Creatinine Ratio: 13 (ref 12–28)
BUN: 10 mg/dL (ref 8–27)
Bilirubin Total: 0.7 mg/dL (ref 0.0–1.2)
CO2: 23 mmol/L (ref 20–29)
Calcium: 9.1 mg/dL (ref 8.7–10.3)
Chloride: 105 mmol/L (ref 96–106)
Creatinine, Ser: 0.75 mg/dL (ref 0.57–1.00)
GFR calc Af Amer: 96 mL/min/{1.73_m2} (ref >59)
GFR calc non Af Amer: 83 mL/min/{1.73_m2} (ref >59)
Globulin, Total: 3.6 g/dL (ref 1.5–4.5)
Glucose: 83 mg/dL (ref 65–99)
Potassium: 4.1 mmol/L (ref 3.5–5.2)
Sodium: 143 mmol/L (ref 134–144)
Total Protein: 7.2 g/dL (ref 6.0–8.5)

## 2020-05-20 LAB — CBC WITH DIFFERENTIAL/PLATELET
Basophils Absolute: 0.1 10*3/uL (ref 0.0–0.2)
Basos: 1 %
EOS (ABSOLUTE): 0.2 10*3/uL (ref 0.0–0.4)
Eos: 3 %
Hematocrit: 36.8 % (ref 34.0–46.6)
Hemoglobin: 12.6 g/dL (ref 11.1–15.9)
Immature Grans (Abs): 0 10*3/uL (ref 0.0–0.1)
Immature Granulocytes: 0 %
Lymphocytes Absolute: 1.7 10*3/uL (ref 0.7–3.1)
Lymphs: 29 %
MCH: 32.6 pg (ref 26.6–33.0)
MCHC: 34.2 g/dL (ref 31.5–35.7)
MCV: 95 fL (ref 79–97)
Monocytes Absolute: 0.5 10*3/uL (ref 0.1–0.9)
Monocytes: 8 %
Neutrophils Absolute: 3.6 10*3/uL (ref 1.4–7.0)
Neutrophils: 59 %
Platelets: 91 10*3/uL — CL (ref 150–450)
RBC: 3.87 x10E6/uL (ref 3.77–5.28)
RDW: 13.1 % (ref 11.7–15.4)
WBC: 6 10*3/uL (ref 3.4–10.8)

## 2020-05-20 LAB — LIPASE: Lipase: 109 U/L — ABNORMAL HIGH (ref 14–72)

## 2020-05-24 ENCOUNTER — Telehealth: Payer: Self-pay

## 2020-05-24 NOTE — Telephone Encounter (Signed)
Tried calling; pt's voice mailbox is not set up.  PEC please advise pt of lab results below if she calls back.   Thanks,    -Mickel Baas

## 2020-05-24 NOTE — Telephone Encounter (Signed)
-----   Message from Virginia Crews, MD sent at 05/23/2020  1:13 PM EDT ----- Lipase is high, could be related to her gallstones.  She needs to see Gen Surg as referred.  If pain is significant, needs to be seen emergently for imaging to ensure no duct clogged with gallstone.

## 2020-05-25 NOTE — Telephone Encounter (Signed)
Phone call to pt. on home phone and mobile #.  Left vm on mobile phone to return call to office for lab results/ recommendations.

## 2020-05-26 ENCOUNTER — Encounter
Admission: RE | Admit: 2020-05-26 | Discharge: 2020-05-26 | Disposition: A | Discharge status: Home / Self Care | Payer: Medicare Other | Source: Ambulatory Visit | Attending: Surgery | Admitting: Surgery

## 2020-05-26 ENCOUNTER — Other Ambulatory Visit: Payer: Self-pay

## 2020-05-26 DIAGNOSIS — I8501 Esophageal varices with bleeding: Secondary | ICD-10-CM | POA: Diagnosis not present

## 2020-05-26 DIAGNOSIS — F17200 Nicotine dependence, unspecified, uncomplicated: Secondary | ICD-10-CM | POA: Insufficient documentation

## 2020-05-26 DIAGNOSIS — Z01818 Encounter for other preprocedural examination: Secondary | ICD-10-CM | POA: Insufficient documentation

## 2020-05-26 DIAGNOSIS — K7460 Unspecified cirrhosis of liver: Secondary | ICD-10-CM | POA: Insufficient documentation

## 2020-05-26 HISTORY — DX: Unspecified cirrhosis of liver: K74.60

## 2020-05-26 HISTORY — DX: Cardiac murmur, unspecified: R01.1

## 2020-05-26 HISTORY — DX: Anemia, unspecified: D64.9

## 2020-05-26 HISTORY — DX: Headache, unspecified: R51.9

## 2020-05-26 NOTE — Patient Instructions (Addendum)
Your procedure is scheduled on:06-01-20 [facility] Report to Day Surgery on the 2nd floor of the [place]. To find out your arrival time, please call [phone] between 1PM - 3PM on: 05-31-20 TUESDAY  REMEMBER: Instructions that are not followed completely may result in serious medical risk, up to and including death; or upon the discretion of your surgeon and anesthesiologist your surgery may need to be rescheduled.  Do not eat food after midnight the night before surgery.  No gum chewing, lozengers or hard candies.  You may however, drink CLEAR liquids up to 2 hours before you are scheduled to arrive for your surgery. Do not drink anything within 2 hours of your scheduled arrival time.  Clear liquids include: - water  - apple juice without pulp - gatorade (not RED, PURPLE, OR BLUE) - black coffee or tea (Do NOT add milk or creamers to the coffee or tea) Do NOT drink anything that is not on this list.  TAKE THESE MEDICATIONS THE MORNING OF SURGERY WITH A SIP OF WATER: -GABAPENTIN (NEURONTIN)  One week prior to surgery: Stop Anti-inflammatories (NSAIDS) such as Advil, Aleve, Ibuprofen, Motrin, Naproxen, Naprosyn and Aspirin based products such as Excedrin, Goodys Powder, BC Powder-OK TO TAKE TYLENOL IF NEEDED  Stop ANY OVER THE COUNTER supplements until after surgery-HAS ALREADY STOPPED FISH OIL, ELDERBERY. STOP YOUR PAPAYA ENZYME AND VITAMIN C NOW-YOU MAY RESUME ALL OF THESE AFTER SURGERY (You may continue taking folate and  multivitamin.)  No Alcohol for 24 hours before or after surgery.  No Smoking including e-cigarettes for 24 hours prior to surgery.  No chewable tobacco products for at least 6 hours prior to surgery.  No nicotine patches on the day of surgery.  Do not use any "recreational" drugs for at least a week prior to your surgery.  Please be advised that the combination of cocaine and anesthesia may have negative outcomes, up to and including death. If you test  positive for cocaine, your surgery will be cancelled.  On the morning of surgery brush your teeth with toothpaste and water, you may rinse your mouth with mouthwash if you wish. Do not swallow any toothpaste or mouthwash.  Do not wear jewelry, make-up, hairpins, clips or nail polish.  Do not wear lotions, powders, or perfumes.   Do not shave 48 hours prior to surgery.   Contact lenses, hearing aids and dentures may not be worn into surgery.  Do not bring valuables to the hospital. [facility] is not responsible for any missing/lost belongings or valuables.   Use CHG Soap as directed on instruction sheet.   Notify your doctor if there is any change in your medical condition (cold, fever, infection).  Wear comfortable clothing (specific to your surgery type) to the hospital.  Plan for stool softeners for home use; pain medications have a tendency to cause constipation. You can also help prevent constipation by eating foods high in fiber such as fruits and vegetables and drinking plenty of fluids as your diet allows.  After surgery, you can help prevent lung complications by doing breathing exercises.  Take deep breaths and cough every 1-2 hours. Your doctor may order a device called an Incentive Spirometer to help you take deep breaths. When coughing or sneezing, hold a pillow firmly against your incision with both hands. This is called "splinting." Doing this helps protect your incision. It also decreases belly discomfort.  If you are being admitted to the hospital overnight, leave your suitcase in the car. After  surgery it may be brought to your room.  If you are being discharged the day of surgery, you will not be allowed to drive home. You will need a responsible adult (18 years or older) to drive you home and stay with you that night.   If you are taking public transportation, you will need to have a responsible adult (18 years or older) with you. Please confirm with your  physician that it is acceptable to use public transportation.   Please call the [facility] at [phone] if you have any questions about these instructions.  Visitation Policy:  Patients undergoing a surgery or procedure may have one family member or support person with them as long as that person is not COVID-19 positive or experiencing its symptoms.  That person may remain in the waiting area during the procedure.  Inpatient Visitation Update:   In an effort to ensure the safety of our team members and our patients, we are implementing a change to our visitation policy:  Effective Monday, Aug. 9, at 7 a.m., inpatients will be allowed one support person.  o The support person may change daily.  o The support person must pass our screening, gel in and out, and wear a mask at all times, including in the patient's room.  o Patients must also wear a mask when staff or their support person are in the room.  o Masking is required regardless of vaccination status.  Systemwide, no visitors 17 or younger.

## 2020-05-27 ENCOUNTER — Encounter
Admission: RE | Admit: 2020-05-27 | Discharge: 2020-05-27 | Disposition: A | Discharge status: Home / Self Care | Payer: Medicare Other | Source: Ambulatory Visit | Attending: Surgery | Admitting: Surgery

## 2020-05-27 DIAGNOSIS — R54 Age-related physical debility: Secondary | ICD-10-CM | POA: Insufficient documentation

## 2020-05-27 DIAGNOSIS — Z01810 Encounter for preprocedural cardiovascular examination: Secondary | ICD-10-CM | POA: Diagnosis not present

## 2020-05-27 DIAGNOSIS — I8390 Asymptomatic varicose veins of unspecified lower extremity: Secondary | ICD-10-CM | POA: Insufficient documentation

## 2020-05-27 DIAGNOSIS — I8501 Esophageal varices with bleeding: Secondary | ICD-10-CM | POA: Diagnosis not present

## 2020-05-27 DIAGNOSIS — Z01818 Encounter for other preprocedural examination: Secondary | ICD-10-CM | POA: Insufficient documentation

## 2020-05-27 DIAGNOSIS — F17200 Nicotine dependence, unspecified, uncomplicated: Secondary | ICD-10-CM | POA: Diagnosis not present

## 2020-05-27 DIAGNOSIS — K7460 Unspecified cirrhosis of liver: Secondary | ICD-10-CM | POA: Insufficient documentation

## 2020-05-27 LAB — PROTIME-INR
INR: 1.1 (ref 0.8–1.2)
Prothrombin Time: 14 seconds (ref 11.4–15.2)

## 2020-05-30 ENCOUNTER — Other Ambulatory Visit: Payer: Self-pay

## 2020-05-30 ENCOUNTER — Other Ambulatory Visit
Admission: RE | Admit: 2020-05-30 | Discharge: 2020-05-30 | Disposition: A | Discharge status: Home / Self Care | Payer: Medicare Other | Source: Ambulatory Visit | Attending: Surgery | Admitting: Surgery

## 2020-05-30 DIAGNOSIS — Z01812 Encounter for preprocedural laboratory examination: Secondary | ICD-10-CM | POA: Insufficient documentation

## 2020-05-30 DIAGNOSIS — Z20822 Contact with and (suspected) exposure to covid-19: Secondary | ICD-10-CM | POA: Insufficient documentation

## 2020-05-30 LAB — SARS CORONAVIRUS 2 (TAT 6-24 HRS): SARS Coronavirus 2: NEGATIVE

## 2020-05-30 NOTE — Telephone Encounter (Signed)
Tried calling pt. No answer. Unable to contact pt. Letter mailed to call the office.

## 2020-05-31 ENCOUNTER — Ambulatory Visit
Admission: RE | Admit: 2020-05-31 | Discharge: 2020-05-31 | Disposition: A | Discharge status: Home / Self Care | Payer: Medicare Other | Source: Ambulatory Visit | Attending: Surgery | Admitting: Surgery

## 2020-05-31 DIAGNOSIS — K766 Portal hypertension: Secondary | ICD-10-CM | POA: Diagnosis not present

## 2020-05-31 DIAGNOSIS — K760 Fatty (change of) liver, not elsewhere classified: Secondary | ICD-10-CM | POA: Diagnosis not present

## 2020-05-31 DIAGNOSIS — K8020 Calculus of gallbladder without cholecystitis without obstruction: Secondary | ICD-10-CM | POA: Diagnosis not present

## 2020-05-31 DIAGNOSIS — R748 Abnormal levels of other serum enzymes: Secondary | ICD-10-CM | POA: Diagnosis not present

## 2020-05-31 DIAGNOSIS — I864 Gastric varices: Secondary | ICD-10-CM | POA: Diagnosis not present

## 2020-05-31 MED ORDER — IOHEXOL 300 MG/ML  SOLN
100.0000 mL | Freq: Once | INTRAMUSCULAR | Status: AC | PRN
  Administered 2020-05-31: 100 mL via INTRAVENOUS

## 2020-06-01 ENCOUNTER — Ambulatory Visit: Payer: Medicare Other | Admitting: Anesthesiology

## 2020-06-01 ENCOUNTER — Encounter: Payer: Self-pay | Admitting: Surgery

## 2020-06-01 ENCOUNTER — Encounter
Admission: RE | Disposition: A | Discharge status: Home / Self Care | Payer: Self-pay | Source: Ambulatory Visit | Attending: Surgery

## 2020-06-01 ENCOUNTER — Ambulatory Visit
Admission: RE | Admit: 2020-06-01 | Discharge: 2020-06-01 | Disposition: A | Discharge status: Home / Self Care | Payer: Medicare Other | Source: Ambulatory Visit | Attending: Surgery | Admitting: Surgery

## 2020-06-01 ENCOUNTER — Other Ambulatory Visit: Payer: Self-pay

## 2020-06-01 DIAGNOSIS — M4802 Spinal stenosis, cervical region: Secondary | ICD-10-CM | POA: Diagnosis not present

## 2020-06-01 DIAGNOSIS — Z886 Allergy status to analgesic agent status: Secondary | ICD-10-CM | POA: Insufficient documentation

## 2020-06-01 DIAGNOSIS — K760 Fatty (change of) liver, not elsewhere classified: Secondary | ICD-10-CM | POA: Diagnosis not present

## 2020-06-01 DIAGNOSIS — K8010 Calculus of gallbladder with chronic cholecystitis without obstruction: Secondary | ICD-10-CM | POA: Diagnosis not present

## 2020-06-01 DIAGNOSIS — Z882 Allergy status to sulfonamides status: Secondary | ICD-10-CM | POA: Insufficient documentation

## 2020-06-01 DIAGNOSIS — K7460 Unspecified cirrhosis of liver: Secondary | ICD-10-CM | POA: Diagnosis not present

## 2020-06-01 DIAGNOSIS — M1990 Unspecified osteoarthritis, unspecified site: Secondary | ICD-10-CM | POA: Diagnosis not present

## 2020-06-01 SURGERY — CHOLECYSTECTOMY, ROBOT-ASSISTED, LAPAROSCOPIC
Anesthesia: General

## 2020-06-01 MED ORDER — DEXAMETHASONE SODIUM PHOSPHATE 10 MG/ML IJ SOLN
INTRAMUSCULAR | Status: DC | PRN
  Administered 2020-06-01: 5 mg via INTRAVENOUS

## 2020-06-01 MED ORDER — CHLORHEXIDINE GLUCONATE CLOTH 2 % EX PADS: 6.0000 | MEDICATED_PAD | Freq: Once | CUTANEOUS | Status: DC

## 2020-06-01 MED ORDER — BUPIVACAINE LIPOSOME 1.3 % IJ SUSP
INTRAMUSCULAR | Status: AC
  Filled 2020-06-01: qty 20

## 2020-06-01 MED ORDER — PHENYLEPHRINE HCL (PRESSORS) 10 MG/ML IV SOLN
INTRAVENOUS | Status: DC | PRN
  Administered 2020-06-01 (×2): 100 ug via INTRAVENOUS

## 2020-06-01 MED ORDER — PROMETHAZINE HCL 25 MG/ML IJ SOLN: 6.2500 mg | Freq: Once | INTRAMUSCULAR | Status: AC

## 2020-06-01 MED ORDER — BUPIVACAINE LIPOSOME 1.3 % IJ SUSP: 20.0000 mL | Freq: Once | INTRAMUSCULAR | Status: DC

## 2020-06-01 MED ORDER — OXYCODONE HCL 5 MG PO TABS
5.0000 mg | ORAL_TABLET | Freq: Once | ORAL | Status: AC
  Administered 2020-06-01: 5 mg via ORAL
  Filled 2020-06-01: qty 1

## 2020-06-01 MED ORDER — SIMETHICONE 80 MG PO CHEW
80.0000 mg | CHEWABLE_TABLET | Freq: Once | ORAL | Status: AC
  Administered 2020-06-01: 80 mg via ORAL
  Filled 2020-06-01 (×2): qty 1

## 2020-06-01 MED ORDER — FENTANYL CITRATE (PF) 100 MCG/2ML IJ SOLN
25.0000 ug | INTRAMUSCULAR | Status: DC | PRN
  Administered 2020-06-01 (×3): 25 ug via INTRAVENOUS

## 2020-06-01 MED ORDER — CHLORHEXIDINE GLUCONATE 0.12 % MT SOLN: 15.0000 mL | Freq: Once | OROMUCOSAL | Status: AC

## 2020-06-01 MED ORDER — PROPOFOL 10 MG/ML IV BOLUS
INTRAVENOUS | Status: DC | PRN
  Administered 2020-06-01: 100 mg via INTRAVENOUS

## 2020-06-01 MED ORDER — CEFAZOLIN SODIUM-DEXTROSE 2-4 GM/100ML-% IV SOLN
INTRAVENOUS | Status: AC
  Filled 2020-06-01: qty 100

## 2020-06-01 MED ORDER — PROPOFOL 10 MG/ML IV BOLUS
INTRAVENOUS | Status: AC
  Filled 2020-06-01: qty 20

## 2020-06-01 MED ORDER — LIDOCAINE HCL (CARDIAC) PF 100 MG/5ML IV SOSY
PREFILLED_SYRINGE | INTRAVENOUS | Status: DC | PRN
  Administered 2020-06-01: 80 mg via INTRAVENOUS

## 2020-06-01 MED ORDER — EPHEDRINE SULFATE 50 MG/ML IJ SOLN
INTRAMUSCULAR | Status: DC | PRN
  Administered 2020-06-01: 5 mg via INTRAVENOUS

## 2020-06-01 MED ORDER — MIDAZOLAM HCL 5 MG/5ML IJ SOLN
INTRAMUSCULAR | Status: DC | PRN
  Administered 2020-06-01: 2 mg via INTRAVENOUS

## 2020-06-01 MED ORDER — FAMOTIDINE 20 MG PO TABS
ORAL_TABLET | ORAL | Status: AC
  Administered 2020-06-01: 20 mg via ORAL
  Filled 2020-06-01: qty 1

## 2020-06-01 MED ORDER — OXYCODONE HCL 5 MG PO TABS: 5.0000 mg | ORAL_TABLET | Freq: Four times a day (QID) | ORAL | 0 refills | Status: AC | PRN

## 2020-06-01 MED ORDER — OXYCODONE HCL 5 MG PO TABS
ORAL_TABLET | ORAL | Status: AC
  Filled 2020-06-01: qty 1

## 2020-06-01 MED ORDER — SUGAMMADEX SODIUM 200 MG/2ML IV SOLN
INTRAVENOUS | Status: DC | PRN
  Administered 2020-06-01: 175 mg via INTRAVENOUS

## 2020-06-01 MED ORDER — FENTANYL CITRATE (PF) 100 MCG/2ML IJ SOLN
INTRAMUSCULAR | Status: DC | PRN
  Administered 2020-06-01 (×2): 50 ug via INTRAVENOUS

## 2020-06-01 MED ORDER — BUPIVACAINE-EPINEPHRINE (PF) 0.25% -1:200000 IJ SOLN
INTRAMUSCULAR | Status: AC
  Filled 2020-06-01: qty 30

## 2020-06-01 MED ORDER — SODIUM CHLORIDE 0.9 % IV BOLUS: 500.0000 mL | Freq: Once | INTRAVENOUS | Status: DC

## 2020-06-01 MED ORDER — LACTATED RINGERS IV SOLN: INTRAVENOUS | Status: DC

## 2020-06-01 MED ORDER — ONDANSETRON HCL 4 MG/2ML IJ SOLN
4.0000 mg | Freq: Once | INTRAMUSCULAR | Status: AC | PRN
  Administered 2020-06-01: 4 mg via INTRAVENOUS

## 2020-06-01 MED ORDER — FENTANYL CITRATE (PF) 100 MCG/2ML IJ SOLN
INTRAMUSCULAR | Status: AC
  Administered 2020-06-01: 25 ug via INTRAVENOUS
  Filled 2020-06-01: qty 2

## 2020-06-01 MED ORDER — ORAL CARE MOUTH RINSE: 15.0000 mL | Freq: Once | OROMUCOSAL | Status: AC

## 2020-06-01 MED ORDER — PROMETHAZINE HCL 25 MG/ML IJ SOLN
INTRAMUSCULAR | Status: AC
  Administered 2020-06-01: 6.25 mg via INTRAVENOUS
  Filled 2020-06-01: qty 1

## 2020-06-01 MED ORDER — SODIUM CHLORIDE FLUSH 0.9 % IV SOLN
INTRAVENOUS | Status: AC
  Filled 2020-06-01: qty 10

## 2020-06-01 MED ORDER — ONDANSETRON HCL 4 MG/2ML IJ SOLN
INTRAMUSCULAR | Status: DC | PRN
  Administered 2020-06-01: 4 mg via INTRAVENOUS

## 2020-06-01 MED ORDER — EPHEDRINE 5 MG/ML INJ
INTRAVENOUS | Status: AC
  Filled 2020-06-01: qty 10

## 2020-06-01 MED ORDER — INDOCYANINE GREEN 25 MG IV SOLR
2.5000 mg | Freq: Once | INTRAVENOUS | Status: AC
  Administered 2020-06-01: 2.5 mg via INTRAVENOUS
  Filled 2020-06-01 (×2): qty 1

## 2020-06-01 MED ORDER — CHLORHEXIDINE GLUCONATE 0.12 % MT SOLN
OROMUCOSAL | Status: AC
  Administered 2020-06-01: 15 mL via OROMUCOSAL
  Filled 2020-06-01: qty 15

## 2020-06-01 MED ORDER — MIDAZOLAM HCL 2 MG/2ML IJ SOLN
INTRAMUSCULAR | Status: AC
  Filled 2020-06-01: qty 2

## 2020-06-01 MED ORDER — ROCURONIUM BROMIDE 100 MG/10ML IV SOLN
INTRAVENOUS | Status: DC | PRN
  Administered 2020-06-01: 40 mg via INTRAVENOUS

## 2020-06-01 MED ORDER — FENTANYL CITRATE (PF) 100 MCG/2ML IJ SOLN
INTRAMUSCULAR | Status: AC
  Filled 2020-06-01: qty 2

## 2020-06-01 MED ORDER — BUPIVACAINE-EPINEPHRINE (PF) 0.25% -1:200000 IJ SOLN
INTRAMUSCULAR | Status: DC | PRN
  Administered 2020-06-01: 28 mL

## 2020-06-01 MED ORDER — CEFAZOLIN SODIUM-DEXTROSE 2-4 GM/100ML-% IV SOLN
2.0000 g | INTRAVENOUS | Status: AC
  Administered 2020-06-01: 2 g via INTRAVENOUS

## 2020-06-01 MED ORDER — FAMOTIDINE 20 MG PO TABS: 20.0000 mg | ORAL_TABLET | Freq: Once | ORAL | Status: AC

## 2020-06-01 MED ORDER — ONDANSETRON HCL 4 MG/2ML IJ SOLN
INTRAMUSCULAR | Status: AC
  Filled 2020-06-01: qty 2

## 2020-06-01 SURGICAL SUPPLY — 52 items
ADH SKN CLS APL DERMABOND .7 (GAUZE/BANDAGES/DRESSINGS) ×2
APL PRP STRL LF DISP 70% ISPRP (MISCELLANEOUS) ×2
BAG SPEC RTRVL LRG 6X4 10 (ENDOMECHANICALS) ×2
CANISTER SUCT 1200ML W/VALVE (MISCELLANEOUS) ×3 IMPLANT
CANNULA REDUC XI 12-8 STAPL (CANNULA) ×3
CANNULA REDUCER 12-8 DVNC XI (CANNULA) IMPLANT
CHLORAPREP W/TINT 26 (MISCELLANEOUS) ×3 IMPLANT
CLIP VESOLOCK MED LG 6/CT (CLIP) ×3 IMPLANT
COVER TIP SHEARS 8 DVNC (MISCELLANEOUS) ×2 IMPLANT
COVER TIP SHEARS 8MM DA VINCI (MISCELLANEOUS) ×6
COVER WAND RF STERILE (DRAPES) ×4 IMPLANT
DECANTER SPIKE VIAL GLASS SM (MISCELLANEOUS) ×3 IMPLANT
DEFOGGER SCOPE WARMER CLEARIFY (MISCELLANEOUS) ×3 IMPLANT
DERMABOND ADVANCED (GAUZE/BANDAGES/DRESSINGS) ×1
DERMABOND ADVANCED .7 DNX12 (GAUZE/BANDAGES/DRESSINGS) ×2 IMPLANT
DRAPE ARM DVNC X/XI (DISPOSABLE) ×8 IMPLANT
DRAPE COLUMN DVNC XI (DISPOSABLE) ×2 IMPLANT
DRAPE DA VINCI XI ARM (DISPOSABLE) ×12
DRAPE DA VINCI XI COLUMN (DISPOSABLE) ×3
ELECT CAUTERY BLADE 6.4 (BLADE) ×3 IMPLANT
GLOVE ORTHO TXT STRL SZ7.5 (GLOVE) ×6 IMPLANT
GOWN STRL REUS W/ TWL LRG LVL3 (GOWN DISPOSABLE) ×8 IMPLANT
GOWN STRL REUS W/TWL LRG LVL3 (GOWN DISPOSABLE) ×12
GRASPER SUT TROCAR 14GX15 (MISCELLANEOUS) IMPLANT
INFUSOR MANOMETER BAG 3000ML (MISCELLANEOUS) IMPLANT
IRRIGATION STRYKERFLOW (MISCELLANEOUS) IMPLANT
IRRIGATOR STRYKERFLOW (MISCELLANEOUS)
IRRIGATOR SUCT 8 DISP DVNC XI (IRRIGATION / IRRIGATOR) IMPLANT
IRRIGATOR SUCTION 8MM XI DISP (IRRIGATION / IRRIGATOR)
IV NS IRRIG 3000ML ARTHROMATIC (IV SOLUTION) IMPLANT
KIT PINK PAD W/HEAD ARE REST (MISCELLANEOUS) ×3
KIT PINK PAD W/HEAD ARM REST (MISCELLANEOUS) ×2 IMPLANT
KIT TURNOVER KIT A (KITS) ×3 IMPLANT
LABEL OR SOLS (LABEL) ×3 IMPLANT
NDL INSUFFLATION 14GA 120MM (NEEDLE) IMPLANT
NEEDLE HYPO 22GX1.5 SAFETY (NEEDLE) ×3 IMPLANT
NEEDLE INSUFFLATION 14GA 120MM (NEEDLE) IMPLANT
NS IRRIG 500ML POUR BTL (IV SOLUTION) ×3 IMPLANT
PACK LAP CHOLECYSTECTOMY (MISCELLANEOUS) ×3 IMPLANT
PENCIL ELECTRO HAND CTR (MISCELLANEOUS) ×3 IMPLANT
POUCH SPECIMEN RETRIEVAL 10MM (ENDOMECHANICALS) ×3 IMPLANT
SEAL CANN UNIV 5-8 DVNC XI (MISCELLANEOUS) ×8 IMPLANT
SEAL XI 5MM-8MM UNIVERSAL (MISCELLANEOUS) ×12
SET TUBE SMOKE EVAC HIGH FLOW (TUBING) ×3 IMPLANT
SOLUTION ELECTROLUBE (MISCELLANEOUS) ×3 IMPLANT
STAPLER CANNULA SEAL DVNC XI (STAPLE) IMPLANT
STAPLER CANNULA SEAL XI (STAPLE) ×3
SUT MNCRL 4-0 (SUTURE) ×3
SUT MNCRL 4-0 27XMFL (SUTURE) ×2
SUT VICRYL 0 AB UR-6 (SUTURE) ×3 IMPLANT
SUTURE MNCRL 4-0 27XMF (SUTURE) ×2 IMPLANT
TROCAR Z-THREAD FIOS 11X100 BL (TROCAR) ×3 IMPLANT

## 2020-06-01 NOTE — Anesthesia Preprocedure Evaluation (Signed)
Anesthesia Evaluation  Patient identified by MRN, date of birth, ID band Patient awake    Reviewed: Allergy & Precautions, NPO status , Patient's Chart, lab work & pertinent test results  Airway Mallampati: III       Dental  (+) Upper Dentures   Pulmonary Patient abstained from smoking., former smoker,    Pulmonary exam normal        Cardiovascular Normal cardiovascular exam     Neuro/Psych  Headaches, negative psych ROS   GI/Hepatic (+) Cirrhosis       ,   Endo/Other  negative endocrine ROS  Renal/GU negative Renal ROS     Musculoskeletal  (+) Arthritis , Osteoarthritis,    Abdominal Normal abdominal exam  (+)   Peds  Hematology  (+) anemia ,   Anesthesia Other Findings Past Medical History: No date: Anemia     Comment:  AGE 64 No date: Cervical stenosis of spine No date: Cirrhosis ([place]) No date: Headache     Comment:  H/O MIGRAINES No date: Heart murmur     Comment:  AS A CHILD- No date: Rhinitis, chronic  Reproductive/Obstetrics                             Anesthesia Physical Anesthesia Plan  ASA: III  Anesthesia Plan: General   Post-op Pain Management:    Induction: Intravenous  PONV Risk Score and Plan:   Airway Management Planned: Oral ETT  Additional Equipment:   Intra-op Plan:   Post-operative Plan:   Informed Consent: I have reviewed the patients History and Physical, chart, labs and discussed the procedure including the risks, benefits and alternatives for the proposed anesthesia with the patient or authorized representative who has indicated his/her understanding and acceptance.     Dental advisory given  Plan Discussed with: CRNA and Surgeon  Anesthesia Plan Comments:         Anesthesia Quick Evaluation

## 2020-06-01 NOTE — Op Note (Signed)
Robotic cholecystectomy  Pre-operative Diagnosis: Chronic calculus cholecystitis, with cirrhosis.  Post-operative Diagnosis:  Same.  Procedure: Robotic assisted laparoscopic cholecystectomy.  Surgeon: Ronny Bacon, M.D., FACS  Anesthesia: General. with endotracheal tube  Findings:     Estimated Blood Loss: 5 mL         Drains: None         Specimens: Gallbladder           Complications: none  Procedure Details  The patient was seen again in the Holding Room.  1.25-2.5 mg dose of ICG was administered intravenously.  The benefits, complications, treatment options, risks and expected outcomes were discussed with the patient. The likelihood of improving the patient's symptoms with return to their baseline status is good.  The patient and/or family concurred with the proposed plan, giving informed consent, again alternatives reviewed.  The patient was taken to Operating Room, identified, and the procedure verified as robotic assisted laparoscopic cholecystectomy.  Prior to the induction of general anesthesia, antibiotic prophylaxis was administered. VTE prophylaxis was in place. General endotracheal anesthesia was then administered and tolerated well. The patient was positioned in the supine position.  After the induction, the abdomen was prepped with Chloraprep and draped in the sterile fashion.  A Time Out was held and the above information confirmed.  After local infiltration of quarter percent Marcaine with epinephrine, stab incision was made left upper quadrant.  Just below the costal margin approximately midclavicular line the Veres needle is passed with sensation of the layers to penetrate the abdominal wall and into the peritoneum.  Saline drop test is confirmed peritoneal placement.  Insufflation is initiated with carbon dioxide to pressures of 15 mmHg.  Right infra-umbilical local infiltration with quarter percent Marcaine with epinephrine is utilized.  Made a 12 mm incision  on the left periumbilical site, I advanced an optical 80mm port under direct visualization into the peritoneal cavity.  Once the peritoneum was penetrated, insufflation was transferred.  The trocar was then advanced into the abdominal cavity under direct visualization. Pneumoperitoneum was then continued with CO2 at 14 mmHg or less and tolerated well without any adverse changes in the patient's vital signs.  Two 8.5-mm ports were placed in the left lower quadrant and laterally, and one to the right lower quadrant, all under direct vision. All skin incisions  were infiltrated with a local anesthetic agent before making the incision and placing the trocars.  The patient was positioned  in reverse Trendelenburg, tilted the patient's left side down.  Da Vinci XI robot was then positioned on to the patient's left side, and docked.  The gallbladder was identified, the fundus grasped via the arm 4 Prograsp and retracted cephalad.  The infundibulum was identified grasped and retracted laterally, exposing the peritoneum overlying the triangle of Calot. This was then opened and dissected using cautery & scissors. An extended critical view of the cystic duct and cystic artery was obtained, aided by the ICG via FireFly which enabled ready visualization of the ductal anatomy.    The cystic duct was clearly identified and dissected to isolation.   Artery well isolated and clipped, and the cystic duct was triple clipped and divided with scissors, leaving two on the remaining stump.  The specimen side of the artery is sealed with bipolar and divided with monopolar scissors.   The gallbladder was taken from the gallbladder fossa in a retrograde fashion with the electrocautery. The gallbladder was removed and placed in an Endocatch bag.  The liver bed is  inspected. Hemostasis was confirmed.  The robot was undocked and moved away from the operative field. No irrigation was utilized and the minimal drainage was aspirated  clear.  The gallbladder and Endocatch sac were then removed through the infraumbilical port site.   Inspection of the right upper quadrant was performed. No bleeding, bile duct injury or leak, or bowel injury was noted. The infra-umbilical port site fascia was closed with interrumpted 0 Vicryl sutures using PMI/cone under direct visualization. Pneumoperitoneum was released and ports removed.  4-0 subcuticular Monocryl was used to close the skin. Dermabond was  applied.  The patient was then extubated and brought to the recovery room in stable condition. Sponge, lap, and needle counts were correct at closure and at the conclusion of the case.               Ronny Bacon, M.D., [facility] 06/01/2020 2:26 PM

## 2020-06-01 NOTE — Interval H&P Note (Signed)
History and Physical Interval Note:  06/01/2020 11:22 AM  Monica Johnson  has presented today for surgery, with the diagnosis of Chronic calculous cholecystitis.  The various methods of treatment have been discussed with the patient and family. After consideration of risks, benefits and other options for treatment, the patient has consented to  Procedure(s): XI ROBOTIC Dixon (N/A) as a surgical intervention.  The patient's history has been reviewed, patient examined, no change in status, stable for surgery.  I have reviewed the patient's chart and labs.  Questions were answered to the patient's satisfaction.     Ronny Bacon

## 2020-06-01 NOTE — Transfer of Care (Signed)
Immediate Anesthesia Transfer of Care Note  Patient: Monica Johnson  Procedure(s) Performed: XI ROBOTIC ASSISTED LAPAROSCOPIC CHOLECYSTECTOMY (N/A ) INDOCYANINE GREEN FLUORESCENCE IMAGING (ICG)  Patient Location: PACU  Anesthesia Type:General  Level of Consciousness: awake, alert  and oriented  Airway & Oxygen Therapy: Patient Spontanous Breathing  Post-op Assessment: Report given to RN and Post -op Vital signs reviewed and stable  Post vital signs: Reviewed and stable  Last Vitals:  Vitals Value Taken Time  BP    Temp    Pulse    Resp    SpO2      Last Pain:  Vitals:   06/01/20 1007  TempSrc: Temporal  PainSc: 0-No pain         Complications: No complications documented.

## 2020-06-01 NOTE — Discharge Instructions (Signed)

## 2020-06-01 NOTE — Anesthesia Procedure Notes (Signed)
Procedure Name: Intubation Date/Time: 06/01/2020 1:00 PM Performed by: Chanetta Marshall, CRNA Pre-anesthesia Checklist: Patient identified, Emergency Drugs available, Suction available and Patient being monitored Patient Re-evaluated:Patient Re-evaluated prior to induction Oxygen Delivery Method: Circle system utilized Preoxygenation: Pre-oxygenation with 100% oxygen Induction Type: IV induction Ventilation: Mask ventilation without difficulty Laryngoscope Size: McGraph and 3 Grade View: Grade I Tube type: Oral Tube size: 7.0 mm Number of attempts: 1 Airway Equipment and Method: Video-laryngoscopy Placement Confirmation: ETT inserted through vocal cords under direct vision,  positive ETCO2,  breath sounds checked- equal and bilateral and CO2 detector Secured at: 21 cm Tube secured with: Tape Dental Injury: Teeth and Oropharynx as per pre-operative assessment

## 2020-06-02 NOTE — Anesthesia Postprocedure Evaluation (Signed)
Anesthesia Post Note  Patient: Monica Johnson  Procedure(s) Performed: XI ROBOTIC ASSISTED LAPAROSCOPIC CHOLECYSTECTOMY (N/A ) INDOCYANINE GREEN FLUORESCENCE IMAGING (ICG)  Patient location during evaluation: PACU Anesthesia Type: General Level of consciousness: awake and alert Pain management: pain level controlled Vital Signs Assessment: post-procedure vital signs reviewed and stable Respiratory status: spontaneous breathing, nonlabored ventilation, respiratory function stable and patient connected to nasal cannula oxygen Cardiovascular status: blood pressure returned to baseline and stable Postop Assessment: no apparent nausea or vomiting Anesthetic complications: no   No complications documented.   Last Vitals:  Vitals:   06/01/20 1929 06/01/20 1933  BP: (!) 106/50 (!) 111/54  Pulse:  80  Resp:  16  Temp:  37.3 C  SpO2:  95%    Last Pain:  Vitals:   06/01/20 1933  TempSrc: Temporal  PainSc: 4                  Arita Miss

## 2020-06-03 LAB — SURGICAL PATHOLOGY

## 2020-06-04 DIAGNOSIS — K2980 Duodenitis without bleeding: Secondary | ICD-10-CM | POA: Diagnosis not present

## 2020-06-04 DIAGNOSIS — Z87891 Personal history of nicotine dependence: Secondary | ICD-10-CM | POA: Diagnosis not present

## 2020-06-04 DIAGNOSIS — Z9049 Acquired absence of other specified parts of digestive tract: Secondary | ICD-10-CM | POA: Diagnosis not present

## 2020-06-04 DIAGNOSIS — N3289 Other specified disorders of bladder: Secondary | ICD-10-CM | POA: Diagnosis not present

## 2020-06-04 DIAGNOSIS — G8918 Other acute postprocedural pain: Secondary | ICD-10-CM | POA: Diagnosis not present

## 2020-06-04 DIAGNOSIS — R1084 Generalized abdominal pain: Secondary | ICD-10-CM | POA: Diagnosis not present

## 2020-06-04 DIAGNOSIS — Z882 Allergy status to sulfonamides status: Secondary | ICD-10-CM | POA: Diagnosis not present

## 2020-06-04 DIAGNOSIS — K766 Portal hypertension: Secondary | ICD-10-CM | POA: Diagnosis not present

## 2020-06-04 DIAGNOSIS — R188 Other ascites: Secondary | ICD-10-CM | POA: Diagnosis not present

## 2020-06-04 DIAGNOSIS — K7460 Unspecified cirrhosis of liver: Secondary | ICD-10-CM | POA: Diagnosis not present

## 2020-06-04 DIAGNOSIS — Z886 Allergy status to analgesic agent status: Secondary | ICD-10-CM | POA: Diagnosis not present

## 2020-06-06 ENCOUNTER — Other Ambulatory Visit
Admission: RE | Admit: 2020-06-06 | Discharge: 2020-06-06 | Disposition: A | Discharge status: Home / Self Care | Payer: Medicare Other | Source: Ambulatory Visit | Attending: Surgery | Admitting: Surgery

## 2020-06-06 ENCOUNTER — Telehealth: Payer: Self-pay | Admitting: Surgery

## 2020-06-06 ENCOUNTER — Other Ambulatory Visit: Payer: Self-pay

## 2020-06-06 ENCOUNTER — Ambulatory Visit (INDEPENDENT_AMBULATORY_CARE_PROVIDER_SITE_OTHER): Payer: Medicare Other | Admitting: Surgery

## 2020-06-06 ENCOUNTER — Encounter: Payer: Self-pay | Admitting: Surgery

## 2020-06-06 ENCOUNTER — Telehealth: Payer: Self-pay

## 2020-06-06 VITALS — BP 121/73 | HR 82 | Temp 98.4°F | Resp 12 | Ht 62.0 in | Wt 187.0 lb

## 2020-06-06 DIAGNOSIS — K7460 Unspecified cirrhosis of liver: Secondary | ICD-10-CM | POA: Diagnosis not present

## 2020-06-06 LAB — CBC WITH DIFFERENTIAL/PLATELET
Abs Immature Granulocytes: 0.06 10*3/uL (ref 0.00–0.07)
Basophils Absolute: 0.1 10*3/uL (ref 0.0–0.1)
Basophils Relative: 1 %
Eosinophils Absolute: 0.3 10*3/uL (ref 0.0–0.5)
Eosinophils Relative: 3 %
HCT: 37.2 % (ref 36.0–46.0)
Hemoglobin: 12.3 g/dL (ref 12.0–15.0)
Immature Granulocytes: 1 %
Lymphocytes Relative: 21 %
Lymphs Abs: 2.1 10*3/uL (ref 0.7–4.0)
MCH: 32.5 pg (ref 26.0–34.0)
MCHC: 33.1 g/dL (ref 30.0–36.0)
MCV: 98.2 fL (ref 80.0–100.0)
Monocytes Absolute: 1.1 10*3/uL — ABNORMAL HIGH (ref 0.1–1.0)
Monocytes Relative: 12 %
Neutro Abs: 6.1 10*3/uL (ref 1.7–7.7)
Neutrophils Relative %: 62 %
Platelets: 120 10*3/uL — ABNORMAL LOW (ref 150–400)
RBC: 3.79 MIL/uL — ABNORMAL LOW (ref 3.87–5.11)
RDW: 13.5 % (ref 11.5–15.5)
WBC: 9.7 10*3/uL (ref 4.0–10.5)
nRBC: 0 % (ref 0.0–0.2)

## 2020-06-06 LAB — COMPREHENSIVE METABOLIC PANEL
ALT: 35 U/L (ref 0–44)
AST: 56 U/L — ABNORMAL HIGH (ref 15–41)
Albumin: 2.9 g/dL — ABNORMAL LOW (ref 3.5–5.0)
Alkaline Phosphatase: 101 U/L (ref 38–126)
Anion gap: 8 (ref 5–15)
BUN: 8 mg/dL (ref 8–23)
CO2: 25 mmol/L (ref 22–32)
Calcium: 8.8 mg/dL — ABNORMAL LOW (ref 8.9–10.3)
Chloride: 103 mmol/L (ref 98–111)
Creatinine, Ser: 0.75 mg/dL (ref 0.44–1.00)
GFR, Estimated: >60 mL/min (ref >60)
Glucose, Bld: 78 mg/dL (ref 70–99)
Potassium: 4.3 mmol/L (ref 3.5–5.1)
Sodium: 136 mmol/L (ref 135–145)
Total Bilirubin: 1.2 mg/dL (ref 0.3–1.2)
Total Protein: 6.8 g/dL (ref 6.5–8.1)

## 2020-06-06 LAB — PROTIME-INR
INR: 1.1 (ref 0.8–1.2)
Prothrombin Time: 14 seconds (ref 11.4–15.2)

## 2020-06-06 NOTE — Patient Instructions (Signed)
Please go directly to the Lab at [facility]. Please start taking the Antibiotic today. Please keep your appointment for tomorrow.

## 2020-06-06 NOTE — Telephone Encounter (Signed)
Patient notified that per Dr Dahlia Byes that her liver tests were fine. She will follow up here tomorrow with Dr Christian Mate.

## 2020-06-06 NOTE — Telephone Encounter (Signed)
Patient is calling and is very concerned about her incision site keeps draining and is asking if she needs to go to the ED, I have moved the patients appointment from next week to tomorrow. Please call patient and advise.

## 2020-06-06 NOTE — Progress Notes (Signed)
  Surgical Consultation  06/06/2020  Monica Johnson is an 64 y.o. female.   Chief Complaint  Patient presents with  . Routine Post Op    Laparascopic Cholecystectomy-06/01/20 Dr.Rodenberg     HPI: s/p rob chole by Dr. Christian Mate 10/27 , pt hx cirrhosis. Pt has started to leak ascites via one port site. Weakness, taking PO. Significant debility. She went to the ER [name] where CT scan and w/u performed. I do not have access to the images but reports did not show anything concerning other than known cirrhosis and portal HTN , small collection around GB fossa not uncommon w recent surgery. She was placed on cipro and flagyl.   Past Medical History:  Diagnosis Date  . Anemia    AGE 67  . Cervical stenosis of spine   . Cirrhosis ([place])   . Headache    H/O MIGRAINES  . Heart murmur    AS A CHILD-  . Rhinitis, chronic     Past Surgical History:  Procedure Laterality Date  . CATARACT EXTRACTION    . TUBAL LIGATION      Family History  Problem Relation Age of Onset  . Diabetes Mellitus II Mother   . Heart failure Mother   . Hypertension Mother   . Uterine cancer Mother   . Cancer Paternal Aunt        unknwon type    Social History:  reports that she quit smoking about 2 weeks ago. Her smoking use included cigarettes. She has a 19.50 pack-year smoking history. She has never used smokeless tobacco. She reports previous alcohol use. She reports that she does not use drugs.  Allergies:  Allergies  Allergen Reactions  . Aspirin Anxiety and Palpitations  . Sulfa Antibiotics Anxiety    Feels like bug crawling in hair/all over body    Medications reviewed.     ROS Full ROS performed and is otherwise negative other than what is stated in the HPI    BP 121/73   Pulse 82   Temp 98.4 F (36.9 C)   Resp 12   Ht 5\' 2"  (1.575 m)   Wt 187 lb (84.8 kg)   SpO2 97%   BMI 34.20 kg/m   Physical Exam Debilitated Abd: mild distension, there is significant ecchymosis and  edema on lower abdominal wall. There is ascites leaking from Left port, No evidence of necrotizing infection, no peritonitis   Assessment/Plan: S/p robotic chole on cirrhotic pt I think he decompensated from liver perspective. She is not toxic and recent CT is reassuring.  I will get CBC, CMP and INR to evaluate PL, liver fx  She does have a f/u appt w Dr. Christian Mate in am that I think she should keep given the tenuous condition. If she deteriorates may need hospitalization for optimization of her medical condition. Currently there appears to be no complications related to her surgery but if she were to deteriorate may need further w/u.  Leaking ascites is a problem on cirrhotic pt w/o any good solution other than improvement of liver fx   Caroleen Hamman, MD [facility] General Surgeon

## 2020-06-06 NOTE — Telephone Encounter (Signed)
Patient stating she is draining from her incision. She has redness. She lives in [place]- spoke with Dr.Pabon and instructed patient to come to the office to be seen this morning.

## 2020-06-07 ENCOUNTER — Ambulatory Visit (INDEPENDENT_AMBULATORY_CARE_PROVIDER_SITE_OTHER): Payer: Medicare Other | Admitting: Surgery

## 2020-06-07 ENCOUNTER — Other Ambulatory Visit
Admission: RE | Admit: 2020-06-07 | Discharge: 2020-06-07 | Disposition: A | Discharge status: Home / Self Care | Payer: Medicare Other | Attending: Surgery | Admitting: Surgery

## 2020-06-07 ENCOUNTER — Encounter: Payer: Medicare Other | Admitting: Surgery

## 2020-06-07 ENCOUNTER — Encounter: Payer: Self-pay | Admitting: Surgery

## 2020-06-07 VITALS — BP 109/68 | HR 79 | Temp 98.2°F | Ht 63.0 in | Wt 192.4 lb

## 2020-06-07 DIAGNOSIS — K7460 Unspecified cirrhosis of liver: Secondary | ICD-10-CM | POA: Insufficient documentation

## 2020-06-07 DIAGNOSIS — Z9049 Acquired absence of other specified parts of digestive tract: Secondary | ICD-10-CM

## 2020-06-07 DIAGNOSIS — R188 Other ascites: Secondary | ICD-10-CM

## 2020-06-07 LAB — BASIC METABOLIC PANEL
Anion gap: 9 (ref 5–15)
BUN: 9 mg/dL (ref 8–23)
CO2: 25 mmol/L (ref 22–32)
Calcium: 8.4 mg/dL — ABNORMAL LOW (ref 8.9–10.3)
Chloride: 101 mmol/L (ref 98–111)
Creatinine, Ser: 0.83 mg/dL (ref 0.44–1.00)
GFR, Estimated: >60 mL/min (ref >60)
Glucose, Bld: 146 mg/dL — ABNORMAL HIGH (ref 70–99)
Potassium: 4.2 mmol/L (ref 3.5–5.1)
Sodium: 135 mmol/L (ref 135–145)

## 2020-06-07 MED ORDER — FUROSEMIDE 40 MG PO TABS: 40.0000 mg | ORAL_TABLET | Freq: Every day | ORAL | 1 refills | Status: DC

## 2020-06-07 MED ORDER — SPIRONOLACTONE 100 MG PO TABS: 100.0000 mg | ORAL_TABLET | Freq: Every day | ORAL | 1 refills | Status: DC

## 2020-06-07 NOTE — Progress Notes (Signed)
[facility] POST-OP OFFICE VISIT  06/07/2020  HPI: Monica Johnson is a 64 y.o. female six days s/p robotic cholecystectomy, in a patient with known cirrhosis without prior history of ascites.  Approximately 3 days postop she began having some clear weeping drainage from one of her laparoscopic incisions.  She subsequently was seen in the ED, started on Cipro and Flagyl after a CT scan revealed some collections of ascitic fluid, and changes I agree sound postoperative.  I have no reason to be suspicious of any duodenitis requiring antibiotics.  He has become somewhat distressing with increasing edema of her lower abdominal wall, she seems more rotund with what I believe is palpable ascitic fluid.  She has never been on diuretics before to treat ascites.  Reviewing her labs from [facility] ED see her LFTs are essentially unremarkable compared to preop.  Her renal function appears good.  Vital signs: BP 109/68   Pulse 79   Temp 98.2 F (36.8 C) (Oral)   Ht 5\' 3"  (1.6 m)   Wt 192 lb 6.4 oz (87.3 kg)   SpO2 98%   BMI 34.08 kg/m    Physical Exam: Constitutional: Bright and alert, no major distress. Abdomen: Edematous abdominal wall, with pink hue changes in dependent areas.  3 out of 4 incisions are clean dry and intact, the left paramedian incision which is where I believe the extraction site was his dripping clear ascitic fluid.  There is no erythema.   Assessment/Plan: This is a 64 y.o. female six days s/p robotic cholecystectomy.  Cirrhosis previously without ascites, currently with abdominal ascites and current ascitic leak out of one of her incisions.  Patient Active Problem List   Diagnosis Date Noted  . CCC (chronic calculous cholecystitis) 05/19/2020  . Fatty (change of) liver, not elsewhere classified 05/19/2020  . Cirrhosis of liver without ascites ([place]) 04/29/2020  . Calculus of gallbladder without cholecystitis without obstruction 10/23/2019  . Liver lesion,  left lobe 10/23/2019  . Cervical stenosis of spine 11/12/2018  . Chronic bilateral low back pain without sciatica 11/12/2018  . Tobacco use disorder 11/12/2018    -We applied a stomal appliance, to help fully diminish her need for keeping a towel or something absorbent handy. I started her on spironolactone 100 mg daily with Lasix 40 mg daily, will check a BMP as soon as possible, and repeat it within a week.   I requested she present back to her PCP to help monitor her electrolytes.  Monitor electrolytes and adjust diuretics in the interim to her seeing GI for further work-up. I will be happy to see her again in the next month or as needed.   Ronny Bacon M.D., FACS 06/07/2020, 2:38 PM

## 2020-06-07 NOTE — Patient Instructions (Addendum)
Dr.Rodenberg apply a Stoma Bag to wound to help with drainage at today's visit. Dr.Rodenberg discussed with patient about prescribing a Diuretic to help with retention of fluid. Patient advised to STOP taking prescribed Antibiotics Flagyl and Cipro. Patient encouraged to try Probiotic and eat plenty of Yogurt.  Cirrhosis  Cirrhosis is long-term (chronic) liver injury. The liver is the body's largest internal organ, and it performs many functions. It converts food into energy, removes toxic material from the blood, makes important proteins, and absorbs necessary vitamins from food. In cirrhosis, healthy liver cells are replaced by scar tissue. This prevents blood from flowing through the liver, making it difficult for the liver to function. Scarring of the liver cannot be reversed, but treatment can prevent it from getting worse. What are the causes? Common causes of this condition are hepatitis C and long-term alcohol abuse. Other causes include:  Nonalcoholic fatty liver disease. This happens when fat is deposited in the liver by causes other than alcohol.  Hepatitis B infection.  Autoimmune hepatitis. In this condition, the body's defense system (immune system) mistakenly attacks the liver cells, causing irritation and swelling (inflammation).  Diseases that cause blockage of ducts inside the liver.  Inherited liver diseases, such as hemochromatosis. This is one of the most common inherited liver diseases. In this disease, deposits of iron collect in the liver and other organs.  Reactions to certain long-term medicines, such as amiodarone, a heart medicine.  Parasitic infections. These include schistosomiasis, which is caused by a flatworm.  Long-term contact to certain toxins. These toxins include certain organic solvents, such as toluene and chloroform. What increases the risk? You are more likely to develop this condition if:  You have certain types of viral hepatitis.  You abuse  alcohol, especially if you are female.  You are overweight.  You share needles.  You have unprotected sex with someone who has viral hepatitis. What are the signs or symptoms? You may not have any signs and symptoms at first. Symptoms may not develop until the damage to your liver starts to get worse. Early symptoms may include:  Weakness and tiredness (fatigue).  Changes in sleep patterns or having trouble sleeping.  Itchiness.  Tenderness in the right-upper part of your abdomen.  Weight loss and muscle loss.  Nausea.  Loss of appetite.  Appearance of tiny blood vessels under the skin. Later symptoms may include:  Fatigue or weakness that is getting worse.  Yellow skin and eyes (jaundice).  Buildup of fluid in the abdomen (ascites). You may notice that your clothes are tight around your waist.  Weight gain.  Swelling of the feet and ankles (edema).  Trouble breathing.  Easy bruising and bleeding.  Vomiting blood.  Black or bloody stool.  Mental confusion. How is this diagnosed? Your health care provider may suspect cirrhosis based on your symptoms and medical history, especially if you have other medical conditions or a history of alcohol abuse. Your health care provider will do a physical exam to feel your liver and to check for signs of cirrhosis. He or she may perform other tests, including:  Blood tests to check: ? For hepatitis B or C. ? Kidney function. ? Liver function.  Imaging tests such as: ? MRI or CT scan to look for changes seen in advanced cirrhosis. ? Ultrasound to see if normal liver tissue is being replaced by scar tissue.  A procedure in which a long needle is used to take a sample of liver tissue to be  checked in a lab (biopsy). Liver biopsy can confirm the diagnosis of cirrhosis. How is this treated? Treatment for this condition depends on how damaged your liver is and what caused the damage. It may include treating the symptoms of  cirrhosis, or treating the underlying causes in order to slow the damage. Treatment may include:  Making lifestyle changes, such as: ? Eating a healthy diet. You may need to work with your health care provider or a diet and nutrition specialist (dietitian) to develop an eating plan. ? Restricting salt intake. ? Maintaining a healthy weight. ? Not abusing drugs or alcohol.  Taking medicines to: ? Treat liver infections or other infections. ? Control itching. ? Reduce fluid buildup. ? Reduce certain blood toxins. ? Reduce risk of bleeding from enlarged blood vessels in the stomach or esophagus (varices).  Liver transplant. In this procedure, a liver from a donor is used to replace your diseased liver. This is done if cirrhosis has caused liver failure. Other treatments and procedures may be done depending on the problems that you get from cirrhosis. Common problems include liver-related kidney failure (hepatorenal syndrome). Follow these instructions at home:   Take medicines only as told by your health care provider. Do not use medicines that are toxic to your liver. Ask your health care provider before taking any new medicines, including over-the-counter medicines.  Rest as needed.  Eat a well-balanced diet. Ask your health care provider or dietitian for more information.  Limit your salt or water intake, if your health care provider asks you to do this.  Do not drink alcohol. This is especially important if you are taking acetaminophen.  Keep all follow-up visits as told by your health care provider. This is important. Contact a health care provider if you:  Have fatigue or weakness that is getting worse.  Develop swelling of the hands, feet, legs, or face.  Have a fever.  Develop loss of appetite.  Have nausea or vomiting.  Develop jaundice.  Develop easy bruising or bleeding. Get help right away if you:  Vomit bright red blood or a material that looks like coffee  grounds.  Have blood in your stools.  Notice that your stools appear black and tarry.  Become confused.  Have chest pain or trouble breathing. Summary  Cirrhosis is chronic liver injury. Liver damage cannot be reversed. Common causes are hepatitis C and long-term alcohol abuse.  Tests used to diagnose cirrhosis include blood tests, imaging tests, and liver biopsy.  Treatment for this condition involves treating the underlying cause. Avoid alcohol, drugs, salt, and medicines that may damage your liver.  Contact your health care provider if you develop ascites, edema, jaundice, fever, nausea or vomiting, easy bruising or bleeding, or worsening fatigue. This information is not intended to replace advice given to you by your health care provider. Make sure you discuss any questions you have with your health care provider. Document Revised: 11/12/2018 Document Reviewed: 06/12/2017 Elsevier Patient Education  [place].

## 2020-06-13 ENCOUNTER — Ambulatory Visit (INDEPENDENT_AMBULATORY_CARE_PROVIDER_SITE_OTHER): Payer: Medicare Other | Admitting: Family Medicine

## 2020-06-13 ENCOUNTER — Other Ambulatory Visit: Payer: Self-pay

## 2020-06-13 ENCOUNTER — Encounter: Payer: Self-pay | Admitting: Family Medicine

## 2020-06-13 VITALS — BP 98/60 | HR 70 | Temp 98.8°F | Resp 16 | Wt 186.0 lb

## 2020-06-13 DIAGNOSIS — R161 Splenomegaly, not elsewhere classified: Secondary | ICD-10-CM | POA: Diagnosis not present

## 2020-06-13 DIAGNOSIS — R188 Other ascites: Secondary | ICD-10-CM | POA: Diagnosis not present

## 2020-06-13 DIAGNOSIS — K7460 Unspecified cirrhosis of liver: Secondary | ICD-10-CM | POA: Diagnosis not present

## 2020-06-13 DIAGNOSIS — I864 Gastric varices: Secondary | ICD-10-CM | POA: Diagnosis not present

## 2020-06-13 NOTE — Patient Instructions (Signed)
Cirrhosis  Cirrhosis is long-term (chronic) liver injury. The liver is the body's largest internal organ, and it performs many functions. It converts food into energy, removes toxic material from the blood, makes important proteins, and absorbs necessary vitamins from food. In cirrhosis, healthy liver cells are replaced by scar tissue. This prevents blood from flowing through the liver, making it difficult for the liver to function. Scarring of the liver cannot be reversed, but treatment can prevent it from getting worse. What are the causes? Common causes of this condition are hepatitis C and long-term alcohol abuse. Other causes include:  Nonalcoholic fatty liver disease. This happens when fat is deposited in the liver by causes other than alcohol.  Hepatitis B infection.  Autoimmune hepatitis. In this condition, the body's defense system (immune system) mistakenly attacks the liver cells, causing irritation and swelling (inflammation).  Diseases that cause blockage of ducts inside the liver.  Inherited liver diseases, such as hemochromatosis. This is one of the most common inherited liver diseases. In this disease, deposits of iron collect in the liver and other organs.  Reactions to certain long-term medicines, such as amiodarone, a heart medicine.  Parasitic infections. These include schistosomiasis, which is caused by a flatworm.  Long-term contact to certain toxins. These toxins include certain organic solvents, such as toluene and chloroform. What increases the risk? You are more likely to develop this condition if:  You have certain types of viral hepatitis.  You abuse alcohol, especially if you are female.  You are overweight.  You share needles.  You have unprotected sex with someone who has viral hepatitis. What are the signs or symptoms? You may not have any signs and symptoms at first. Symptoms may not develop until the damage to your liver starts to get worse. Early  symptoms may include:  Weakness and tiredness (fatigue).  Changes in sleep patterns or having trouble sleeping.  Itchiness.  Tenderness in the right-upper part of your abdomen.  Weight loss and muscle loss.  Nausea.  Loss of appetite.  Appearance of tiny blood vessels under the skin. Later symptoms may include:  Fatigue or weakness that is getting worse.  Yellow skin and eyes (jaundice).  Buildup of fluid in the abdomen (ascites). You may notice that your clothes are tight around your waist.  Weight gain.  Swelling of the feet and ankles (edema).  Trouble breathing.  Easy bruising and bleeding.  Vomiting blood.  Black or bloody stool.  Mental confusion. How is this diagnosed? Your health care provider may suspect cirrhosis based on your symptoms and medical history, especially if you have other medical conditions or a history of alcohol abuse. Your health care provider will do a physical exam to feel your liver and to check for signs of cirrhosis. He or she may perform other tests, including:  Blood tests to check: ? For hepatitis B or C. ? Kidney function. ? Liver function.  Imaging tests such as: ? MRI or CT scan to look for changes seen in advanced cirrhosis. ? Ultrasound to see if normal liver tissue is being replaced by scar tissue.  A procedure in which a long needle is used to take a sample of liver tissue to be checked in a lab (biopsy). Liver biopsy can confirm the diagnosis of cirrhosis. How is this treated? Treatment for this condition depends on how damaged your liver is and what caused the damage. It may include treating the symptoms of cirrhosis, or treating the underlying causes in order to   slow the damage. Treatment may include:  Making lifestyle changes, such as: ? Eating a healthy diet. You may need to work with your health care provider or a diet and nutrition specialist (dietitian) to develop an eating plan. ? Restricting salt  intake. ? Maintaining a healthy weight. ? Not abusing drugs or alcohol.  Taking medicines to: ? Treat liver infections or other infections. ? Control itching. ? Reduce fluid buildup. ? Reduce certain blood toxins. ? Reduce risk of bleeding from enlarged blood vessels in the stomach or esophagus (varices).  Liver transplant. In this procedure, a liver from a donor is used to replace your diseased liver. This is done if cirrhosis has caused liver failure. Other treatments and procedures may be done depending on the problems that you get from cirrhosis. Common problems include liver-related kidney failure (hepatorenal syndrome). Follow these instructions at home:   Take medicines only as told by your health care provider. Do not use medicines that are toxic to your liver. Ask your health care provider before taking any new medicines, including over-the-counter medicines.  Rest as needed.  Eat a well-balanced diet. Ask your health care provider or dietitian for more information.  Limit your salt or water intake, if your health care provider asks you to do this.  Do not drink alcohol. This is especially important if you are taking acetaminophen.  Keep all follow-up visits as told by your health care provider. This is important. Contact a health care provider if you:  Have fatigue or weakness that is getting worse.  Develop swelling of the hands, feet, legs, or face.  Have a fever.  Develop loss of appetite.  Have nausea or vomiting.  Develop jaundice.  Develop easy bruising or bleeding. Get help right away if you:  Vomit bright red blood or a material that looks like coffee grounds.  Have blood in your stools.  Notice that your stools appear black and tarry.  Become confused.  Have chest pain or trouble breathing. Summary  Cirrhosis is chronic liver injury. Liver damage cannot be reversed. Common causes are hepatitis C and long-term alcohol abuse.  Tests used to  diagnose cirrhosis include blood tests, imaging tests, and liver biopsy.  Treatment for this condition involves treating the underlying cause. Avoid alcohol, drugs, salt, and medicines that may damage your liver.  Contact your health care provider if you develop ascites, edema, jaundice, fever, nausea or vomiting, easy bruising or bleeding, or worsening fatigue. This information is not intended to replace advice given to you by your health care provider. Make sure you discuss any questions you have with your health care provider. Document Revised: 11/12/2018 Document Reviewed: 06/12/2017 Elsevier Patient Education  2020 [employer]vier Inc[employer]  

## 2020-06-13 NOTE — Assessment & Plan Note (Signed)
Found on CT abdomen pelvis recently Discussed with patient and her daughter that this is a downstream effect from her cirrhosis and could improve with control of her cirrhosis

## 2020-06-13 NOTE — Assessment & Plan Note (Signed)
Noted on recent CT abd/pelvis Described what these are to patient and her daughter and showed picture diagram Discussed need for Beta blocker, but BP is soft today Will need EGD with GI At f/u in 2 weeks, will recheck BP and see if able to tolerate low dose Carvedilol after getting used to diuretics as below Discussed emergent precautions

## 2020-06-13 NOTE — Assessment & Plan Note (Signed)
Likely secondary to Monica Johnson  declines alcohol and Tylenol use Discussed avoiding hepatotoxic substances Reviewed MRI liver with patient and her daughter and there is no evidence of HCC or lesion as was originally described on right upper quadrant ultrasound GI evaluation upcoming in 08/2020 Ascites is starting to improve, so we will continue spironolactone 100 mg daily and Lasix 40 mg daily Discussed lifestyle changes Check hepatitis panel Recheck renal function panel as she recently started to diuretics as above Follow-up in 2 weeks for recheck

## 2020-06-13 NOTE — Progress Notes (Signed)
Established patient visit   Patient: Monica Johnson   DOB: 24-Mar-1956   64 y.o. Female  MRN: [id number] Visit Date: 06/13/2020  Today's healthcare provider: Lavon Paganini, MD   Chief Complaint  Patient presents with  . Follow-up   Subjective    HPI  Follow up for ascites  The patient was last seen for this 1 weeks ago at [facility]. Changes made at last visit include starting Lasix 40mg  daily and Spironolactone 100mg  daily.  She reports excellent compliance with treatment. She feels that condition is Improved. Ascites/swelling has decreased significantly.  Denies fevers, blood in stool, vomiting, confusion.    She is eating better with decreased fats in her diet and more fresh fruits and vegetables.  She is not having side effects.   -----------------------------------------------------------------------------------------  Reviewed recent CT abdomen pelvis which showed cirrhosis with gastric varices and splenomegaly, as well as ascites.  Patient Active Problem List   Diagnosis Date Noted  . 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 Splenomegaly 06/13/2020  . S/P laparoscopic cholecystectomy 06/07/2020  . Cirrhosis of liver with ascites ([place]) 04/29/2020  . Liver lesion, left lobe 10/23/2019  . Cervical stenosis of spine 11/12/2018  . Chronic bilateral low back pain without sciatica 11/12/2018  . Tobacco use disorder 11/12/2018   Past Surgical History:  Procedure Laterality Date  . CATARACT EXTRACTION    . TUBAL LIGATION     Social History   Tobacco Use  . Smoking status: Former Smoker    Packs/day: 0.50    Years: 39.00    Pack years: 19.50    Types: Cigarettes    Quit date: 05/19/2020    Years since quitting: 0.0  . Smokeless tobacco: Never Used  Vaping Use  . Vaping Use: Never used  Substance Use Topics  . Alcohol use: Not Currently    Comment: a glass of wine occasionally  . Drug use: No   Allergies  Allergen Reactions  . Aspirin Anxiety  and Palpitations  . Sulfa Antibiotics Anxiety    Feels like bug crawling in hair/all over body       Medications: Outpatient Medications Prior to Visit  Medication Sig  . Ascorbic Acid (VITAMIN C) 1000 MG tablet Take 1,000 mg by mouth daily.  Marland Kitchen ELDERBERRY PO Take 1 tablet by mouth daily.  . Folic Acid (FOLATE PO) Take 1 tablet by mouth daily.  . furosemide (LASIX) 40 MG tablet Take 1 tablet (40 mg total) by mouth daily.  Marland Kitchen gabapentin (NEURONTIN) 400 MG capsule Take 1 capsule (400 mg total) by mouth 3 (three) times daily.  . methocarbamol (ROBAXIN) 500 MG tablet Take 1 tablet (500 mg total) by mouth every 8 (eight) hours as needed. (Patient taking differently: Take 500 mg by mouth every 8 (eight) hours as needed for muscle spasms. )  . Multiple Vitamin (MULTIVITAMIN WITH MINERALS) TABS tablet Take 1 tablet by mouth daily. Alive for Women  . Omega-3 Fatty Acids (FISH OIL) 1000 MG CAPS Take 1,000 mg by mouth daily.  Marland Kitchen oxyCODONE (OXY IR/ROXICODONE) 5 MG immediate release tablet Take 1 tablet (5 mg total) by mouth every 6 (six) hours as needed for severe pain.  Marland Kitchen PAPAYA ENZYME PO Take 1 tablet by mouth 4 (four) times daily as needed (nausea/vomiting (with meals)). W/digestive enzymes.  Marland Kitchen spironolactone (ALDACTONE) 100 MG tablet Take 1 tablet (100 mg total) by mouth daily.  . [DISCONTINUED] ciprofloxacin (CIPRO) 500 MG tablet Take by mouth.   No facility-administered medications  prior to visit.    Review of Systems  Constitutional: Negative.   Respiratory: Negative.   Cardiovascular: Negative.   Gastrointestinal: Positive for abdominal distention. Negative for abdominal pain, blood in stool, constipation, diarrhea, nausea and vomiting.  Neurological: Negative.   Psychiatric/Behavioral: Negative.     Last CBC Lab Results  Component Value Date   WBC 9.7 06/06/2020   HGB 12.3 06/06/2020   HCT 37.2 06/06/2020   MCV 98.2 06/06/2020   MCH 32.5 06/06/2020   RDW 13.5 06/06/2020   PLT 120  (L) 32/67/1245   Last metabolic panel Lab Results  Component Value Date   GLUCOSE 146 (H) 06/07/2020   NA 135 06/07/2020   K 4.2 06/07/2020   CL 101 06/07/2020   CO2 25 06/07/2020   BUN 9 06/07/2020   CREATININE 0.83 06/07/2020   GFRNONAA >60 06/07/2020   GFRAA 96 05/19/2020   CALCIUM 8.4 (L) 06/07/2020   PROT 6.8 06/06/2020   ALBUMIN 2.9 (L) 06/06/2020   LABGLOB 3.6 05/19/2020   AGRATIO 1.0 (L) 05/19/2020   BILITOT 1.2 06/06/2020   ALKPHOS 101 06/06/2020   AST 56 (H) 06/06/2020   ALT 35 06/06/2020   ANIONGAP 9 06/07/2020      Objective    BP 98/60 (BP Location: Right Arm, Patient Position: Sitting, Cuff Size: Normal)   Pulse 70   Temp 98.8 F (37.1 C) (Oral)   Resp 16   Wt 186 lb (84.4 kg)   SpO2 99%   BMI 32.95 kg/m  BP Readings from Last 3 Encounters:  06/13/20 98/60  06/07/20 109/68  06/06/20 121/73   Wt Readings from Last 3 Encounters:  06/13/20 186 lb (84.4 kg)  06/07/20 192 lb 6.4 oz (87.3 kg)  06/06/20 187 lb (84.8 kg)      Physical Exam Vitals reviewed.  Constitutional:      General: She is not in acute distress.    Appearance: Normal appearance. She is not diaphoretic.  HENT:     Head: Normocephalic and atraumatic.  Eyes:     Conjunctiva/sclera: Conjunctivae normal.  Cardiovascular:     Rate and Rhythm: Normal rate and regular rhythm.     Heart sounds: Normal heart sounds. No murmur heard.   Pulmonary:     Effort: Pulmonary effort is normal. No respiratory distress.     Breath sounds: Normal breath sounds. No wheezing.  Abdominal:     General: There is distension.     Palpations: There is fluid wave. There is no mass.     Tenderness: There is no abdominal tenderness. There is no guarding or rebound.  Musculoskeletal:     Right lower leg: No edema.     Left lower leg: No edema.  Skin:    General: Skin is warm and dry.     Findings: No rash.  Neurological:     Mental Status: She is alert and oriented to person, place, and time.  Mental status is at baseline.  Psychiatric:        Mood and Affect: Mood normal.        Behavior: Behavior normal.       No results found for any visits on 06/13/20.  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    Noted on recent CT abd/pelvis Described what these are to patient and her daughter and showed picture diagram Discussed need for Beta blocker,  but BP is soft today Will need EGD with GI At f/u in 2 weeks, will recheck BP and see if able to tolerate low dose Carvedilol after getting used to diuretics as below Discussed emergent precautions        Digestive   Cirrhosis of liver with ascites ([place]) - Primary    Likely secondary to Karlene Lineman  declines alcohol and Tylenol use Discussed avoiding hepatotoxic substances Reviewed MRI liver with patient and her daughter and there is no evidence of HCC or lesion as was originally described on right upper quadrant ultrasound GI evaluation upcoming in 08/2020 Ascites is starting to improve, so we will continue spironolactone 100 mg daily and Lasix 40 mg daily Discussed lifestyle changes Check hepatitis panel Recheck renal function panel as she recently started to diuretics as above Follow-up in 2 weeks for recheck      Relevant Orders   Renal Function Panel   Hepatitis, Acute     Other   Splenomegaly    Found on CT abdomen pelvis recently Discussed with patient and her daughter that this is a downstream effect from her cirrhosis and could improve with control of her cirrhosis          Return in about 11 days (around 06/24/2020) for chronic disease f/u.      I, Lavon Paganini, MD, have reviewed all documentation for this visit. The documentation on 06/13/20 for the exam, diagnosis, procedures, and orders are all accurate and complete.   Murle Hellstrom, Dionne Bucy, MD, MPH [facility]

## 2020-06-14 LAB — RENAL FUNCTION PANEL
Albumin: 3.4 g/dL — ABNORMAL LOW (ref 3.8–4.8)
BUN/Creatinine Ratio: 10 — ABNORMAL LOW (ref 12–28)
BUN: 9 mg/dL (ref 8–27)
CO2: 28 mmol/L (ref 20–29)
Calcium: 9 mg/dL (ref 8.7–10.3)
Chloride: 98 mmol/L (ref 96–106)
Creatinine, Ser: 0.92 mg/dL (ref 0.57–1.00)
GFR calc Af Amer: 75 mL/min/{1.73_m2} (ref >59)
GFR calc non Af Amer: 65 mL/min/{1.73_m2} (ref >59)
Glucose: 86 mg/dL (ref 65–99)
Phosphorus: 2.8 mg/dL — ABNORMAL LOW (ref 3.0–4.3)
Potassium: 4.3 mmol/L (ref 3.5–5.2)
Sodium: 136 mmol/L (ref 134–144)

## 2020-06-14 LAB — HEPATITIS PANEL, ACUTE
Hep A IgM: NEGATIVE
Hep B C IgM: NEGATIVE
Hep C Virus Ab: 0.2 s/co ratio (ref 0.0–0.9)
Hepatitis B Surface Ag: NEGATIVE

## 2020-06-16 ENCOUNTER — Encounter: Payer: Medicare Other | Admitting: Surgery

## 2020-06-24 ENCOUNTER — Ambulatory Visit (INDEPENDENT_AMBULATORY_CARE_PROVIDER_SITE_OTHER): Payer: Medicare Other | Admitting: Family Medicine

## 2020-06-24 ENCOUNTER — Other Ambulatory Visit: Payer: Self-pay

## 2020-06-24 ENCOUNTER — Other Ambulatory Visit: Payer: Self-pay | Admitting: Family Medicine

## 2020-06-24 ENCOUNTER — Encounter: Payer: Self-pay | Admitting: Family Medicine

## 2020-06-24 VITALS — BP 98/60 | HR 80 | Temp 98.0°F | Resp 16 | Wt 181.0 lb

## 2020-06-24 DIAGNOSIS — R161 Splenomegaly, not elsewhere classified: Secondary | ICD-10-CM | POA: Diagnosis not present

## 2020-06-24 DIAGNOSIS — K7460 Unspecified cirrhosis of liver: Secondary | ICD-10-CM

## 2020-06-24 DIAGNOSIS — I864 Gastric varices: Secondary | ICD-10-CM

## 2020-06-24 DIAGNOSIS — R188 Other ascites: Secondary | ICD-10-CM

## 2020-06-24 MED ORDER — SPIRONOLACTONE 100 MG PO TABS: 100.0000 mg | ORAL_TABLET | Freq: Every day | ORAL | 3 refills | Status: DC

## 2020-06-24 MED ORDER — METHOCARBAMOL 500 MG PO TABS
500.0000 mg | ORAL_TABLET | Freq: Three times a day (TID) | ORAL | 5 refills | Status: DC | PRN
Start: 2020-06-24 — End: 2020-07-04

## 2020-06-24 MED ORDER — FUROSEMIDE 40 MG PO TABS: 40.0000 mg | ORAL_TABLET | Freq: Every day | ORAL | 3 refills | Status: DC

## 2020-06-24 NOTE — Assessment & Plan Note (Signed)
Likely secondary to [facility] Avoid hepatotoxic substances MRI liver showed no evidence of HCC or lesion as was originally described on right upper quadrant ultrasound GI evaluation upcoming in 08/2020 Ascites is starting to improve, so continue spironolactone 100 mg daily and Lasix 40 mg daily Recheck metabolic panel

## 2020-06-24 NOTE — Assessment & Plan Note (Signed)
Noted on CT abd/pelvis Discussed need for beta-blocker, but blood pressure is soft today We will need an EGD with GI Awaiting upcoming GI appointment At follow-up, we will reassess BP and see if we are able to tolerate a low-dose carvedilol

## 2020-06-24 NOTE — Patient Instructions (Signed)
Cirrhosis  Cirrhosis is long-term (chronic) liver injury. The liver is the body's largest internal organ, and it performs many functions. It converts food into energy, removes toxic material from the blood, makes important proteins, and absorbs necessary vitamins from food. In cirrhosis, healthy liver cells are replaced by scar tissue. This prevents blood from flowing through the liver, making it difficult for the liver to function. Scarring of the liver cannot be reversed, but treatment can prevent it from getting worse. What are the causes? Common causes of this condition are hepatitis C and long-term alcohol abuse. Other causes include:  Nonalcoholic fatty liver disease. This happens when fat is deposited in the liver by causes other than alcohol.  Hepatitis B infection.  Autoimmune hepatitis. In this condition, the body's defense system (immune system) mistakenly attacks the liver cells, causing irritation and swelling (inflammation).  Diseases that cause blockage of ducts inside the liver.  Inherited liver diseases, such as hemochromatosis. This is one of the most common inherited liver diseases. In this disease, deposits of iron collect in the liver and other organs.  Reactions to certain long-term medicines, such as amiodarone, a heart medicine.  Parasitic infections. These include schistosomiasis, which is caused by a flatworm.  Long-term contact to certain toxins. These toxins include certain organic solvents, such as toluene and chloroform. What increases the risk? You are more likely to develop this condition if:  You have certain types of viral hepatitis.  You abuse alcohol, especially if you are female.  You are overweight.  You share needles.  You have unprotected sex with someone who has viral hepatitis. What are the signs or symptoms? You may not have any signs and symptoms at first. Symptoms may not develop until the damage to your liver starts to get worse. Early  symptoms may include:  Weakness and tiredness (fatigue).  Changes in sleep patterns or having trouble sleeping.  Itchiness.  Tenderness in the right-upper part of your abdomen.  Weight loss and muscle loss.  Nausea.  Loss of appetite.  Appearance of tiny blood vessels under the skin. Later symptoms may include:  Fatigue or weakness that is getting worse.  Yellow skin and eyes (jaundice).  Buildup of fluid in the abdomen (ascites). You may notice that your clothes are tight around your waist.  Weight gain.  Swelling of the feet and ankles (edema).  Trouble breathing.  Easy bruising and bleeding.  Vomiting blood.  Black or bloody stool.  Mental confusion. How is this diagnosed? Your health care provider may suspect cirrhosis based on your symptoms and medical history, especially if you have other medical conditions or a history of alcohol abuse. Your health care provider will do a physical exam to feel your liver and to check for signs of cirrhosis. He or she may perform other tests, including:  Blood tests to check: ? For hepatitis B or C. ? Kidney function. ? Liver function.  Imaging tests such as: ? MRI or CT scan to look for changes seen in advanced cirrhosis. ? Ultrasound to see if normal liver tissue is being replaced by scar tissue.  A procedure in which a long needle is used to take a sample of liver tissue to be checked in a lab (biopsy). Liver biopsy can confirm the diagnosis of cirrhosis. How is this treated? Treatment for this condition depends on how damaged your liver is and what caused the damage. It may include treating the symptoms of cirrhosis, or treating the underlying causes in order to   slow the damage. Treatment may include:  Making lifestyle changes, such as: ? Eating a healthy diet. You may need to work with your health care provider or a diet and nutrition specialist (dietitian) to develop an eating plan. ? Restricting salt  intake. ? Maintaining a healthy weight. ? Not abusing drugs or alcohol.  Taking medicines to: ? Treat liver infections or other infections. ? Control itching. ? Reduce fluid buildup. ? Reduce certain blood toxins. ? Reduce risk of bleeding from enlarged blood vessels in the stomach or esophagus (varices).  Liver transplant. In this procedure, a liver from a donor is used to replace your diseased liver. This is done if cirrhosis has caused liver failure. Other treatments and procedures may be done depending on the problems that you get from cirrhosis. Common problems include liver-related kidney failure (hepatorenal syndrome). Follow these instructions at home:   Take medicines only as told by your health care provider. Do not use medicines that are toxic to your liver. Ask your health care provider before taking any new medicines, including over-the-counter medicines.  Rest as needed.  Eat a well-balanced diet. Ask your health care provider or dietitian for more information.  Limit your salt or water intake, if your health care provider asks you to do this.  Do not drink alcohol. This is especially important if you are taking acetaminophen.  Keep all follow-up visits as told by your health care provider. This is important. Contact a health care provider if you:  Have fatigue or weakness that is getting worse.  Develop swelling of the hands, feet, legs, or face.  Have a fever.  Develop loss of appetite.  Have nausea or vomiting.  Develop jaundice.  Develop easy bruising or bleeding. Get help right away if you:  Vomit bright red blood or a material that looks like coffee grounds.  Have blood in your stools.  Notice that your stools appear black and tarry.  Become confused.  Have chest pain or trouble breathing. Summary  Cirrhosis is chronic liver injury. Liver damage cannot be reversed. Common causes are hepatitis C and long-term alcohol abuse.  Tests used to  diagnose cirrhosis include blood tests, imaging tests, and liver biopsy.  Treatment for this condition involves treating the underlying cause. Avoid alcohol, drugs, salt, and medicines that may damage your liver.  Contact your health care provider if you develop ascites, edema, jaundice, fever, nausea or vomiting, easy bruising or bleeding, or worsening fatigue. This information is not intended to replace advice given to you by your health care provider. Make sure you discuss any questions you have with your health care provider. Document Revised: 11/12/2018 Document Reviewed: 06/12/2017 Elsevier Patient Education  2020 [employer]vier Inc[employer]  

## 2020-06-24 NOTE — Telephone Encounter (Signed)
Requested medication (s) are due for refill today: new Rx requested for PA from insurance  Requested medication (s) are on the active medication list: no , Rx for Robaxin 500 mg   Last refill:  NA  Future visit scheduled: yes 1 month   Notes to clinic:  not delegated per protocol, zanaflex is alternative requested and preferred by insurance may need prior approval instead of Robaxin. Please clarify if Rx is to be changed from Robaxin, on med list to zanaflex which is not on med list.      Requested Prescriptions  Pending Prescriptions Disp Refills   tiZANidine (ZANAFLEX) 2 MG tablet [Pharmacy Med Name: TIZANIDINE HCL 2 MG TABLET] 60 tablet 0      Not Delegated - Cardiovascular:  Alpha-2 Agonists - tizanidine Failed - 06/24/2020 10:42 AM      Failed - This refill cannot be delegated      Passed - Valid encounter within last 6 months    Recent Outpatient Visits           Today Cirrhosis of liver with ascites, unspecified hepatic cirrhosis type [facility])   [place], Dionne Bucy, MD   1 week ago Cirrhosis of liver with ascites, unspecified hepatic cirrhosis type [facility])   [facility], Dionne Bucy, MD   1 month ago Calculus of gallbladder without cholecystitis without obstruction   [facility], Dionne Bucy, MD   8 months ago Tobacco use disorder   [facility] [place], Dionne Bucy, MD   1 year ago Cervical stenosis of spine   [facility] Bacigalupo, Dionne Bucy, MD       Future Appointments             In 1 month Bacigalupo, Dionne Bucy, MD [facility] [place]

## 2020-06-24 NOTE — Progress Notes (Signed)
Established patient visit   Patient: Monica Johnson   DOB: May 29, 1956   64 y.o. Female  MRN: [id number] Visit Date: 06/24/2020  Today's healthcare provider: Lavon Paganini, MD   Chief Complaint  Patient presents with  . Follow-up   Subjective    HPI   Follow up for Cirrhosis  The patient was last seen for this 11 days ago. Changes made at last visit include check labs.  She reports excellent compliance with treatment. She feels that condition is Improved. She is not having side effects.   -----------------------------------------------------------------------------------------  Patient Active Problem List   Diagnosis Date Noted  . 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 Splenomegaly 06/13/2020  . S/P laparoscopic cholecystectomy 06/07/2020  . Cirrhosis of liver with ascites ([place]) 04/29/2020  . Liver lesion, left lobe 10/23/2019  . Cervical stenosis of spine 11/12/2018  . Chronic bilateral low back pain without sciatica 11/12/2018  . Tobacco use disorder 11/12/2018   Social History   Tobacco Use  . Smoking status: Former Smoker    Packs/day: 0.50    Years: 39.00    Pack years: 19.50    Types: Cigarettes    Quit date: 05/19/2020    Years since quitting: 0.0  . Smokeless tobacco: Never Used  Vaping Use  . Vaping Use: Never used  Substance Use Topics  . Alcohol use: Not Currently    Comment: a glass of wine occasionally  . Drug use: No   Allergies  Allergen Reactions  . Aspirin Anxiety and Palpitations  . Sulfa Antibiotics Anxiety    Feels like bug crawling in hair/all over body       Medications: Outpatient Medications Prior to Visit  Medication Sig  . Ascorbic Acid (VITAMIN C) 1000 MG tablet Take 1,000 mg by mouth daily.  Marland Kitchen ELDERBERRY PO Take 1 tablet by mouth daily.  . Folic Acid (FOLATE PO) Take 1 tablet by mouth daily.  Marland Kitchen gabapentin (NEURONTIN) 400 MG capsule Take 1 capsule (400 mg total) by mouth 3 (three) times daily.  . Multiple  Vitamin (MULTIVITAMIN WITH MINERALS) TABS tablet Take 1 tablet by mouth daily. Alive for Women  . Omega-3 Fatty Acids (FISH OIL) 1000 MG CAPS Take 1,000 mg by mouth daily.  Marland Kitchen oxyCODONE (OXY IR/ROXICODONE) 5 MG immediate release tablet Take 1 tablet (5 mg total) by mouth every 6 (six) hours as needed for severe pain.  Marland Kitchen PAPAYA ENZYME PO Take 1 tablet by mouth 4 (four) times daily as needed (nausea/vomiting (with meals)). W/digestive enzymes.  . [DISCONTINUED] furosemide (LASIX) 40 MG tablet Take 1 tablet (40 mg total) by mouth daily.  . [DISCONTINUED] methocarbamol (ROBAXIN) 500 MG tablet Take 1 tablet (500 mg total) by mouth every 8 (eight) hours as needed. (Patient taking differently: Take 500 mg by mouth every 8 (eight) hours as needed for muscle spasms. )  . [DISCONTINUED] spironolactone (ALDACTONE) 100 MG tablet Take 1 tablet (100 mg total) by mouth daily.   No facility-administered medications prior to visit.    Review of Systems  Constitutional: Negative.   Respiratory: Negative.   Cardiovascular: Negative.     Last CBC Lab Results  Component Value Date   WBC 9.7 06/06/2020   HGB 12.3 06/06/2020   HCT 37.2 06/06/2020   MCV 98.2 06/06/2020   MCH 32.5 06/06/2020   RDW 13.5 06/06/2020   PLT 120 (L) 40/97/3532   Last metabolic panel Lab Results  Component Value Date   GLUCOSE 86 06/13/2020   NA  136 06/13/2020   K 4.3 06/13/2020   CL 98 06/13/2020   CO2 28 06/13/2020   BUN 9 06/13/2020   CREATININE 0.92 06/13/2020   GFRNONAA 65 06/13/2020   GFRAA 75 06/13/2020   CALCIUM 9.0 06/13/2020   PHOS 2.8 (L) 06/13/2020   PROT 6.8 06/06/2020   ALBUMIN 3.4 (L) 06/13/2020   LABGLOB 3.6 05/19/2020   AGRATIO 1.0 (L) 05/19/2020   BILITOT 1.2 06/06/2020   ALKPHOS 101 06/06/2020   AST 56 (H) 06/06/2020   ALT 35 06/06/2020   ANIONGAP 9 06/07/2020      Objective    BP 98/60 (BP Location: Right Arm, Patient Position: Sitting, Cuff Size: Normal)   Pulse 80   Temp 98 F (36.7 C)  (Oral)   Resp 16   Wt 181 lb (82.1 kg)   SpO2 99%   BMI 32.06 kg/m  BP Readings from Last 3 Encounters:  06/24/20 98/60  06/13/20 98/60  06/07/20 109/68   Wt Readings from Last 3 Encounters:  06/24/20 181 lb (82.1 kg)  06/13/20 186 lb (84.4 kg)  06/07/20 192 lb 6.4 oz (87.3 kg)      Physical Exam Vitals reviewed.  Constitutional:      General: She is not in acute distress.    Appearance: Normal appearance. She is not diaphoretic.  HENT:     Head: Normocephalic and atraumatic.  Eyes:     Conjunctiva/sclera: Conjunctivae normal.  Cardiovascular:     Rate and Rhythm: Normal rate and regular rhythm.     Heart sounds: Normal heart sounds. No murmur heard.   Pulmonary:     Effort: Pulmonary effort is normal. No respiratory distress.     Breath sounds: Normal breath sounds. No wheezing.  Abdominal:     General: There is distension.     Palpations: Abdomen is soft. There is fluid wave.     Tenderness: There is no abdominal tenderness. There is no guarding or rebound.  Musculoskeletal:     Right lower leg: No edema.     Left lower leg: No edema.  Skin:    General: Skin is warm and dry.     Findings: No rash.     Comments: Incisions healing well on abdomen  Neurological:     Mental Status: She is alert and oriented to person, place, and time. Mental status is at baseline.     Gait: Gait normal.  Psychiatric:        Mood and Affect: Mood normal.        Behavior: Behavior normal.      No results found for any visits on 06/24/20.  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    Noted on CT abd/pelvis Discussed need for beta-blocker, but blood pressure is soft today We will need an EGD with GI Awaiting upcoming GI appointment At follow-up, we will reassess BP and see if we are able to tolerate a low-dose carvedilol      Relevant Medications   furosemide (LASIX) 40 MG tablet    spironolactone (ALDACTONE) 100 MG tablet   Other Relevant Orders   Comprehensive metabolic panel     Digestive   Cirrhosis of liver with ascites ([place]) - Primary    Likely secondary to [facility] Avoid hepatotoxic substances MRI liver showed no evidence of HCC or lesion as was originally described on right upper quadrant ultrasound GI  evaluation upcoming in 08/2020 Ascites is starting to improve, so continue spironolactone 100 mg daily and Lasix 40 mg daily Recheck metabolic panel      Relevant Orders   Comprehensive metabolic panel     Other   Splenomegaly    Discussed relationship to cirrhosis again with patient      Relevant Orders   Comprehensive metabolic panel       Return in about 4 weeks (around 07/22/2020) for chronic disease f/u.      I, Lavon Paganini, MD, have reviewed all documentation for this visit. The documentation on 06/24/20 for the exam, diagnosis, procedures, and orders are all accurate and complete.   Joshua Soulier, Dionne Bucy, MD, MPH [facility]

## 2020-06-24 NOTE — Assessment & Plan Note (Signed)
Discussed relationship to cirrhosis again with patient

## 2020-06-25 LAB — COMPREHENSIVE METABOLIC PANEL
ALT: 39 IU/L — ABNORMAL HIGH (ref 0–32)
AST: 71 IU/L — ABNORMAL HIGH (ref 0–40)
Albumin/Globulin Ratio: 1 — ABNORMAL LOW (ref 1.2–2.2)
Albumin: 3.9 g/dL (ref 3.8–4.8)
Alkaline Phosphatase: 149 IU/L — ABNORMAL HIGH (ref 44–121)
BUN/Creatinine Ratio: 17 (ref 12–28)
BUN: 15 mg/dL (ref 8–27)
Bilirubin Total: 0.7 mg/dL (ref 0.0–1.2)
CO2: 26 mmol/L (ref 20–29)
Calcium: 9.4 mg/dL (ref 8.7–10.3)
Chloride: 100 mmol/L (ref 96–106)
Creatinine, Ser: 0.86 mg/dL (ref 0.57–1.00)
GFR calc Af Amer: 81 mL/min/{1.73_m2} (ref >59)
GFR calc non Af Amer: 71 mL/min/{1.73_m2} (ref >59)
Globulin, Total: 4 g/dL (ref 1.5–4.5)
Glucose: 94 mg/dL (ref 65–99)
Potassium: 4.4 mmol/L (ref 3.5–5.2)
Sodium: 137 mmol/L (ref 134–144)
Total Protein: 7.9 g/dL (ref 6.0–8.5)

## 2020-06-29 ENCOUNTER — Other Ambulatory Visit: Payer: Self-pay | Admitting: Surgery

## 2020-07-25 ENCOUNTER — Encounter: Payer: Self-pay | Admitting: Family Medicine

## 2020-07-25 ENCOUNTER — Other Ambulatory Visit: Payer: Self-pay

## 2020-07-25 ENCOUNTER — Ambulatory Visit (INDEPENDENT_AMBULATORY_CARE_PROVIDER_SITE_OTHER): Payer: Medicare Other | Admitting: Family Medicine

## 2020-07-25 VITALS — BP 110/60 | HR 90 | Temp 98.3°F | Resp 16 | Wt 178.5 lb

## 2020-07-25 DIAGNOSIS — K7460 Unspecified cirrhosis of liver: Secondary | ICD-10-CM

## 2020-07-25 DIAGNOSIS — Z2821 Immunization not carried out because of patient refusal: Secondary | ICD-10-CM

## 2020-07-25 DIAGNOSIS — Z5320 Procedure and treatment not carried out because of patient's decision for unspecified reasons: Secondary | ICD-10-CM

## 2020-07-25 DIAGNOSIS — M542 Cervicalgia: Secondary | ICD-10-CM

## 2020-07-25 DIAGNOSIS — R188 Other ascites: Secondary | ICD-10-CM

## 2020-07-25 NOTE — Assessment & Plan Note (Signed)
Chronic intermittent neck pain No radicular symptoms Muscle tightness on exam Encouraged muscle relaxer as needed

## 2020-07-25 NOTE — Assessment & Plan Note (Signed)
-   counseled on safety and efficacy of COVID19 vaccines -Encouraged her to proceed with COVID-19 vaccine as her  reaction from COVID-19 infection would likely be much more severe than from COVID-19 vaccination -Discussed population health and the importance of vaccines to achieve herd immunity -Answered any questions and concerns that she  had about the vaccine - patient remains adamant that she will not get the vaccine

## 2020-07-25 NOTE — Progress Notes (Signed)
Established patient visit   Patient: Monica Johnson   DOB: 05/23/1956   64 y.o. Female  MRN: [id number] Visit Date: 07/25/2020  Today's healthcare provider: Lavon Paganini, MD   Chief Complaint  Patient presents with  . Follow-up   Subjective    HPI  Follow up for blood pressure  The patient was last seen for this 1 months ago. Changes made at last visit include no changes.  She reports excellent compliance with treatment. She feels that condition is Improved.  She is not having side effects.   Swelling is down.  No lightheadedness.  BP Readings from Last 3 Encounters:  07/25/20 110/60  06/24/20 98/60  06/13/20 98/60   ----------------------------------------------------------------------------------------- Neck pain with headache after bending down to clean the oven.  Has not picked up tizanidine since last visit.  Thinks this will help.  Patient Active Problem List   Diagnosis Date Noted  . Neck pain 07/25/2020  . COVID-19 vaccination declined 07/25/2020  . 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 Splenomegaly 06/13/2020  . S/P laparoscopic cholecystectomy 06/07/2020  . Cirrhosis of liver with ascites ([place]) 04/29/2020  . Liver lesion, left lobe 10/23/2019  . Cervical stenosis of spine 11/12/2018  . Chronic bilateral low back pain without sciatica 11/12/2018  . Tobacco use disorder 11/12/2018   Social History   Tobacco Use  . Smoking status: Former Smoker    Packs/day: 0.50    Years: 39.00    Pack years: 19.50    Types: Cigarettes    Quit date: 05/19/2020    Years since quitting: 0.1  . Smokeless tobacco: Never Used  Vaping Use  . Vaping Use: Never used  Substance Use Topics  . Alcohol use: Not Currently    Comment: a glass of wine occasionally  . Drug use: No   Allergies  Allergen Reactions  . Aspirin Anxiety and Palpitations  . Sulfa Antibiotics Anxiety    Feels like bug crawling in hair/all over body        Medications: Outpatient Medications Prior to Visit  Medication Sig  . Ascorbic Acid (VITAMIN C) 1000 MG tablet Take 1,000 mg by mouth daily.  . furosemide (LASIX) 40 MG tablet TAKE 1 TABLET BY MOUTH EVERY DAY  . gabapentin (NEURONTIN) 400 MG capsule Take 1 capsule (400 mg total) by mouth 3 (three) times daily.  . Multiple Vitamin (MULTIVITAMIN WITH MINERALS) TABS tablet Take 1 tablet by mouth daily. Alive for Women  . Omega-3 Fatty Acids (FISH OIL) 1000 MG CAPS Take 1,000 mg by mouth daily.  Marland Kitchen oxyCODONE (OXY IR/ROXICODONE) 5 MG immediate release tablet Take 1 tablet (5 mg total) by mouth every 6 (six) hours as needed for severe pain.  Marland Kitchen PAPAYA ENZYME PO Take 1 tablet by mouth 4 (four) times daily as needed (nausea/vomiting (with meals)). W/digestive enzymes.  Marland Kitchen spironolactone (ALDACTONE) 100 MG tablet TAKE 1 TABLET BY MOUTH EVERY DAY  . tiZANidine (ZANAFLEX) 2 MG tablet Take 1 tablet (2 mg total) by mouth every 8 (eight) hours as needed for muscle spasms.  Marland Kitchen ELDERBERRY PO Take 1 tablet by mouth daily.  . Folic Acid (FOLATE PO) Take 1 tablet by mouth daily. (Patient not taking: Reported on 07/25/2020)   No facility-administered medications prior to visit.    Review of Systems  Constitutional: Negative.   Respiratory: Negative.   Cardiovascular: Negative.   Neurological: Positive for headaches.    Last CBC Lab Results  Component Value Date   WBC 9.7  06/06/2020   HGB 12.3 06/06/2020   HCT 37.2 06/06/2020   MCV 98.2 06/06/2020   MCH 32.5 06/06/2020   RDW 13.5 06/06/2020   PLT 120 (L) 06/06/2020      Objective    BP 110/60 (BP Location: Right Arm, Patient Position: Sitting, Cuff Size: Normal)   Pulse 90   Temp 98.3 F (36.8 C) (Oral)   Resp 16   Wt 178 lb 8 oz (81 kg)   SpO2 99%   BMI 31.62 kg/m  BP Readings from Last 3 Encounters:  07/25/20 110/60  06/24/20 98/60  06/13/20 98/60   Wt Readings from Last 3 Encounters:  07/25/20 178 lb 8 oz (81 kg)  06/24/20 181 lb  (82.1 kg)  06/13/20 186 lb (84.4 kg)      Physical Exam Vitals reviewed.  Constitutional:      General: She is not in acute distress.    Appearance: Normal appearance. She is well-developed. She is not diaphoretic.  HENT:     Head: Normocephalic and atraumatic.  Eyes:     General: No scleral icterus.    Conjunctiva/sclera: Conjunctivae normal.  Neck:     Thyroid: No thyromegaly.  Cardiovascular:     Rate and Rhythm: Normal rate and regular rhythm.     Pulses: Normal pulses.     Heart sounds: Normal heart sounds. No murmur heard.   Pulmonary:     Effort: Pulmonary effort is normal. No respiratory distress.     Breath sounds: Normal breath sounds. No wheezing, rhonchi or rales.  Musculoskeletal:     Cervical back: Neck supple.     Right lower leg: No edema.     Left lower leg: No edema.  Lymphadenopathy:     Cervical: No cervical adenopathy.  Skin:    General: Skin is warm and dry.     Findings: No rash.  Neurological:     Mental Status: She is alert and oriented to person, place, and time. Mental status is at baseline.  Psychiatric:        Mood and Affect: Mood normal.        Behavior: Behavior normal.       No results found for any visits on 07/25/20.  Assessment & Plan     Problem List Items Addressed This Visit      Digestive   Cirrhosis of liver with ascites ([place]) - Primary    Likely secondary to [facility] Avoid hepatotoxic substances MRI liver showed no evidence of HCC or lesion as was originally described on right upper quadrant ultrasound GI evaluation upcoming in 2 weeks Ascites is improved Blood pressure stable Continue spironolactone 100 mg daily and Lasix 40 mg daily No labs today as she is likely to have these done with GI        Other   Neck pain    Chronic intermittent neck pain No radicular symptoms Muscle tightness on exam Encouraged muscle relaxer as needed      COVID-19 vaccination declined    - counseled on safety and efficacy of  COVID19 vaccines -Encouraged her to proceed with COVID-19 vaccine as her  reaction from COVID-19 infection would likely be much more severe than from COVID-19 vaccination -Discussed population health and the importance of vaccines to achieve herd immunity -Answered any questions and concerns that she  had about the vaccine - patient remains adamant that she will not get the vaccine       Other Visit Diagnoses    Screening mammography  declined       Colon cancer screening declined           Counseled on the importance of cancer screenings.  Discussed the difference between screening in asymptomatic people and diagnostic testing and symptomatic patients.  Patient adamantly declines breast cancer and colon cancer screening.   Return in about 3 months (around 10/23/2020) for AWV, chronic disease f/u.      I, Lavon Paganini, MD, have reviewed all documentation for this visit. The documentation on 07/25/20 for the exam, diagnosis, procedures, and orders are all accurate and complete.   Reeves Musick, Dionne Bucy, MD, MPH [facility]

## 2020-07-25 NOTE — Assessment & Plan Note (Signed)
Likely secondary to [facility] Avoid hepatotoxic substances MRI liver showed no evidence of HCC or lesion as was originally described on right upper quadrant ultrasound GI evaluation upcoming in 2 weeks Ascites is improved Blood pressure stable Continue spironolactone 100 mg daily and Lasix 40 mg daily No labs today as she is likely to have these done with GI

## 2020-08-01 ENCOUNTER — Other Ambulatory Visit: Payer: Self-pay | Admitting: Physician Assistant

## 2020-08-01 ENCOUNTER — Ambulatory Visit: Payer: Self-pay

## 2020-08-01 MED ORDER — BACLOFEN 10 MG PO TABS: 10.0000 mg | ORAL_TABLET | Freq: Three times a day (TID) | ORAL | 0 refills | Status: DC

## 2020-08-01 NOTE — Telephone Encounter (Signed)
Stop Tizanadine.  Will change to baclofen. Stay well hydrated while taking baclofen

## 2020-08-01 NOTE — Telephone Encounter (Signed)
Patient called and says the Tizanidine that Dr. Beryle Flock prescribed is causing dry mouth and dizziness. She says the medication is not helping the muscle spasms. She says she was standing in the bathroom, looked in the mirror and was white as a sheet in the face, then she almost passed out. She says she was able to make it to the bed to sit down. She hasn't taken anymore of the pills. I advised I will send this to the office and someone will call back with the provider recommendation. Patient verbalized understanding.   Reason for Disposition . [1] Caller has URGENT medicine question about med that PCP or specialist prescribed AND [2] triager unable to answer question  Answer Assessment - Initial Assessment Questions 1. NAME of MEDICATION: "What medicine are you calling about?"     Tizanidine 2 mg 2. QUESTION: "What is your question?" (e.g., medication refill, side effect)     Reactions to medication 3. PRESCRIBING HCP: "Who prescribed it?" Reason: if prescribed by specialist, call should be referred to that group.     Dr. Beryle Flock 4. SYMPTOMS: "Do you have any symptoms?"     Yes 5. SEVERITY: If symptoms are present, ask "Are they mild, moderate or severe?"     Moderate-Severe 6. PREGNANCY:  "Is there any chance that you are pregnant?" "When was your last menstrual period?"     No  Protocols used: MEDICATION QUESTION CALL-A-AH

## 2020-08-09 ENCOUNTER — Ambulatory Visit (INDEPENDENT_AMBULATORY_CARE_PROVIDER_SITE_OTHER): Payer: Medicare Other | Admitting: Gastroenterology

## 2020-08-09 ENCOUNTER — Other Ambulatory Visit: Payer: Self-pay

## 2020-08-09 ENCOUNTER — Encounter: Payer: Self-pay | Admitting: Gastroenterology

## 2020-08-09 VITALS — BP 100/65 | HR 86 | Ht 62.0 in | Wt 178.6 lb

## 2020-08-09 DIAGNOSIS — K7460 Unspecified cirrhosis of liver: Secondary | ICD-10-CM | POA: Diagnosis not present

## 2020-08-09 DIAGNOSIS — R188 Other ascites: Secondary | ICD-10-CM

## 2020-08-09 NOTE — Progress Notes (Signed)
Gastroenterology Consultation  Referring Provider:     Campbell Lerner, MD Primary Care Physician:  Erasmo Downer, MD Primary Gastroenterologist:  Dr. Servando Snare     Reason for Consultation:     Cirrhosis of the liver        HPI:   Monica Johnson is a 65 y.o. y/o female referred for consultation & management of Cirrhosis of the liver by Dr. Beryle Flock, Marzella Schlein, MD.  This patient comes in today after being told that she had cirrhosis of the liver.  The patient states that she was told it may have been caused by fatty liver disease.  The patient had her gallbladder out and after having her gallbladder out and ascites that was treated with diuretics. The patient had imaging that showed her to have a nodular liver consistent with cirrhosis and signs of portal hypertension.  The patient denies any alcohol abuse.  She also denies any drug abuse or taking hepatotoxic drugs in the past.  She also denies diabetes and reports that at the present time she is heavier now than she had been in the past. She denies any abdominal pain or having jaundice.  The patient also denies any fevers chills nausea or vomiting. The patient is tearful and states that she is concerned, she never knew she had these issues before. The patient also reports that she is never had a colonoscopy in the past.  Past Medical History:  Diagnosis Date  . Anemia    AGE 24  . Cervical stenosis of spine   . Cirrhosis (HCC)   . Headache    H/O MIGRAINES  . Heart murmur    AS A CHILD-  . Rhinitis, chronic     Past Surgical History:  Procedure Laterality Date  . CATARACT EXTRACTION    . TUBAL LIGATION      Prior to Admission medications   Medication Sig Start Date End Date Taking? Authorizing Provider  Ascorbic Acid (VITAMIN C) 1000 MG tablet Take 1,000 mg by mouth daily.   Yes [provider]  baclofen (LIORESAL) 10 MG tablet Take 1 tablet (10 mg total) by mouth 3 (three) times daily. 08/01/20  Yes Burnette,  Alessandra Bevels, PA-C  ELDERBERRY PO Take 1 tablet by mouth daily.   Yes [provider]  furosemide (LASIX) 40 MG tablet TAKE 1 TABLET BY MOUTH EVERY DAY 07/04/20  Yes Campbell Lerner, MD  gabapentin (NEURONTIN) 400 MG capsule Take 1 capsule (400 mg total) by mouth 3 (three) times daily. 04/29/20  Yes Bacigalupo, Marzella Schlein, MD  Multiple Vitamin (MULTIVITAMIN WITH MINERALS) TABS tablet Take 1 tablet by mouth daily. Alive for Women   Yes [provider]  Omega-3 Fatty Acids (FISH OIL) 1000 MG CAPS Take 1,000 mg by mouth daily.   Yes [provider]  oxyCODONE (OXY IR/ROXICODONE) 5 MG immediate release tablet Take 1 tablet (5 mg total) by mouth every 6 (six) hours as needed for severe pain. 06/01/20  Yes Campbell Lerner, MD  PAPAYA ENZYME PO Take 1 tablet by mouth 4 (four) times daily as needed (nausea/vomiting (with meals)). W/digestive enzymes.   Yes [provider]  spironolactone (ALDACTONE) 100 MG tablet TAKE 1 TABLET BY MOUTH EVERY DAY 07/04/20  Yes Campbell Lerner, MD  Folic Acid (FOLATE PO) Take 1 tablet by mouth daily. Patient not taking: No sig reported    [provider]    Family History  Problem Relation Age of Onset  . Diabetes Mellitus II  Mother   . Heart failure Mother   . Hypertension Mother   . Uterine cancer Mother   . Cancer Paternal Aunt        unknwon type     Social History   Tobacco Use  . Smoking status: Former Smoker    Packs/day: 0.50    Years: 39.00    Pack years: 19.50    Types: Cigarettes    Quit date: 05/19/2020    Years since quitting: 0.2  . Smokeless tobacco: Never Used  Vaping Use  . Vaping Use: Never used  Substance Use Topics  . Alcohol use: Not Currently    Comment: a glass of wine occasionally  . Drug use: No    Allergies as of 08/09/2020 - Review Complete 08/09/2020  Allergen Reaction Noted  . Aspirin Anxiety and Palpitations 02/01/2016  . Sulfa antibiotics Anxiety 02/01/2016    Review of  Systems:    All systems reviewed and negative except where noted in HPI.   Physical Exam:  BP 100/65   Pulse 86   Ht 5\' 2"  (1.575 m)   Wt 178 lb 9.6 oz (81 kg)   BMI 32.67 kg/m  No LMP recorded. Patient is postmenopausal. General:   Alert,  Well-developed, well-nourished, pleasant and cooperative in NAD Head:  Normocephalic and atraumatic. Eyes:  Sclera clear, no icterus.   Conjunctiva pink. Ears:  Normal auditory acuity. Neck:  Supple; no masses or thyromegaly. Lungs:  Respirations even and unlabored.  Clear throughout to auscultation.   No wheezes, crackles, or rhonchi. No acute distress. Heart:  Regular rate and rhythm; no murmurs, clicks, rubs, or gallops. Abdomen:  Normal bowel sounds.  No bruits.  Soft, non-tender and non-distended without masses, hepatosplenomegaly or hernias noted.  No guarding or rebound tenderness.  Negative Carnett sign.   Rectal:  Deferred.  Pulses:  Normal pulses noted. Extremities:  No clubbing or edema.  No cyanosis. Neurologic:  Alert and oriented x3;  grossly normal neurologically. Skin:  Intact without significant lesions or rashes.  No jaundice. Lymph Nodes:  No significant cervical adenopathy. Psych:  Alert and cooperative. Normal mood and affect.  Imaging Studies: No results found.  Assessment and Plan:   Monica Johnson is a 65 y.o. y/o female who comes in today with a history of an incidental finding of cirrhosis with ascites after having her gallbladder out. The patient had a negative acute hepatitis panel.  The patient will have labs sent off for other possible causes of her cirrhosis and will be set up for an EGD and colonoscopy.  The EGD will be for evaluation of esophageal varices and the colonoscopy will be done for screening purposes.  The patient has been reassured and explained the plan and agrees with it.    [name], MD. Marval Regal    Note: This dictation was prepared with Dragon dictation along with smaller phrase technology. Any  transcriptional errors that result from this process are unintentional.

## 2020-08-10 LAB — HEPATIC FUNCTION PANEL
ALT: 39 IU/L — ABNORMAL HIGH (ref 0–32)
AST: 60 IU/L — ABNORMAL HIGH (ref 0–40)
Albumin: 4.1 g/dL (ref 3.8–4.8)
Alkaline Phosphatase: 157 IU/L — ABNORMAL HIGH (ref 44–121)
Bilirubin Total: 0.6 mg/dL (ref 0.0–1.2)
Bilirubin, Direct: 0.31 mg/dL (ref 0.00–0.40)
Total Protein: 8.1 g/dL (ref 6.0–8.5)

## 2020-08-10 LAB — ANTI-SMOOTH MUSCLE ANTIBODY, IGG: Smooth Muscle Ab: 40 Units — ABNORMAL HIGH (ref 0–19)

## 2020-08-10 LAB — IRON AND TIBC
Iron Saturation: 24 % (ref 15–55)
Iron: 83 ug/dL (ref 27–139)
Total Iron Binding Capacity: 345 ug/dL (ref 250–450)
UIBC: 262 ug/dL (ref 118–369)

## 2020-08-10 LAB — MITOCHONDRIAL ANTIBODIES: Mitochondrial Ab: <20 Units (ref 0.0–20.0)

## 2020-08-10 LAB — HEPATITIS B SURFACE ANTIBODY,QUALITATIVE: Hep B Surface Ab, Qual: NONREACTIVE

## 2020-08-10 LAB — ANA: Anti Nuclear Antibody (ANA): POSITIVE — AB

## 2020-08-10 LAB — FERRITIN: Ferritin: 171 ng/mL — ABNORMAL HIGH (ref 15–150)

## 2020-08-10 LAB — CERULOPLASMIN: Ceruloplasmin: 34.9 mg/dL (ref 19.0–39.0)

## 2020-08-10 LAB — ALPHA-1-ANTITRYPSIN: A-1 Antitrypsin: 170 mg/dL (ref 101–187)

## 2020-08-10 LAB — HEPATITIS A ANTIBODY, TOTAL: hep A Total Ab: POSITIVE — AB

## 2020-08-16 ENCOUNTER — Ambulatory Visit: Payer: Self-pay

## 2020-08-16 NOTE — Telephone Encounter (Signed)
If anyone has acute visit open tomorrow, ok to schedule, but if more urgent than that with no openings, may need to reconsider urgent care.

## 2020-08-16 NOTE — Telephone Encounter (Signed)
Summary: Clinical Advice   Patient experiencing back pain for weeks. Patient states she tried methocarbamol which did not work and then she was prescribed tiZANidine (ZANAFLEX) 2 MG tablet which caused an allergic reaction. Patient states the current medication baclofen (LIORESAL) 10 MG tablet is not working. Scheduled patient for 08/18/2020 with PCP, patient seeking clinical advice prior to appt.       Pt with upcoming video visit calling to report severe mid back pain and sciatica down to knee. No numbness or tingling to legs or feet or loss of bowel of bladder control. Pt stated her pain and spasms are severe and are worse with movement.  Pt stated that she began having worsening back pain since 10/21.  Pt has tried heating pad, epsom salt water warm bath.  Pt called to request back brace for support and stated she needs a prescription for her pain. Pt has tried Robaxin, tizanidine, and baclofen and "nothing has helped." Pt stated she was prescribed oxycodone after gall bladder surgery 10/21.  Advised pt that she needs to go to [facility], but pt refused. Could not find a virtual appt sooner than the current appt time. Pt refused to see anyone other than her PCP.- Routing pt request and refusal to go to [facility] to office for review. Called [facility] and infored her of triage call and severe pain- Jiles Garter will show note to Dr. Salley Scarlet.  Reason for Disposition . [1] SEVERE back pain (e.g., excruciating, unable to do any normal activities) AND [2] not improved 2 hours after pain medicine  Answer Assessment - Initial Assessment Questions 1. ONSET: "When did the pain begin?"      Chronic- pain since 10/21 2. LOCATION: "Where does it hurt?" (upper, mid or lower back)     Mid back to the left side of back 3. SEVERITY: "How bad is the pain?"  (e.g., Scale 1-10; mild, moderate, or severe)   - MILD (1-3): doesn't interfere with normal activities    - MODERATE (4-7): interferes with normal activities or awakens  from sleep    - SEVERE (8-10): excruciating pain, unable to do any normal activities      Severe- 9/10 4. PATTERN: "Is the pain constant?" (e.g., yes, no; constant, intermittent)      Constant- worse with movement  5. RADIATION: "Does the pain shoot into your legs or elsewhere?"     Lower left part of down to buttocks to knee 6. CAUSE:  "What do you think is causing the back pain?"      Previous work injury and spinal stenosis 7. BACK OVERUSE:  "Any recent lifting of heavy objects, strenuous work or exercise?"     Was riding in truck with husband for several days 8. MEDICATIONS: "What have you taken so far for the pain?" (e.g., nothing, acetaminophen, NSAIDS)     Heating pad, epsom salt warm water bath 9. NEUROLOGIC SYMPTOMS: "Do you have any weakness, numbness, or problems with bowel/bladder control?"     no 10. OTHER SYMPTOMS: "Do you have any other symptoms?" (e.g., fever, abdominal pain, burning with urination, blood in urine)       No other sx 11. PREGNANCY: "Is there any chance you are pregnant?" (e.g., yes, no; LMP)      n/a  Protocols used: BACK PAIN-A-AH

## 2020-08-18 ENCOUNTER — Telehealth (INDEPENDENT_AMBULATORY_CARE_PROVIDER_SITE_OTHER): Payer: Medicare Other | Admitting: Family Medicine

## 2020-08-18 ENCOUNTER — Encounter: Payer: Self-pay | Admitting: Family Medicine

## 2020-08-18 DIAGNOSIS — M5441 Lumbago with sciatica, right side: Secondary | ICD-10-CM

## 2020-08-18 DIAGNOSIS — G8929 Other chronic pain: Secondary | ICD-10-CM | POA: Diagnosis not present

## 2020-08-18 NOTE — Progress Notes (Signed)
Telephone Video Visit    Virtual Visit via Video Note - transition to telephone   This visit type was conducted due to national recommendations for restrictions regarding the COVID-19 Pandemic (e.g. social distancing) in an effort to limit this patient's exposure and mitigate transmission in our community. This patient is at least at moderate risk for complications without adequate follow up. This format is felt to be most appropriate for this patient at this time. Physical exam was limited by quality of the video and audio technology used for the visit.    Patient location: home Provider location: [place] involved in the visit: patient, provider  I discussed the limitations of evaluation and management by telemedicine and the availability of in person appointments. The patient expressed understanding and agreed to proceed.  Interactive audio and video communications were attempted, although failed due to patient's inability to connect to video. Continued visit with audio only interaction with patient agreement.     Patient: Monica Johnson   DOB: 1956-04-23   65 y.o. Female  MRN: [id number] Visit Date: 08/18/2020  Today's healthcare provider: Lavon Paganini, MD   Chief Complaint  Patient presents with  . Back Pain    I,Angela Bacigalupo,acting as a scribe for Lavon Paganini, MD.,have documented all relevant documentation on the behalf of Lavon Paganini, MD,as directed by  Lavon Paganini, MD while in the presence of Lavon Paganini, MD.  Subjective    Back Pain This is a recurrent problem. The current episode started more than 1 month ago. The problem occurs constantly. The problem is unchanged. The pain is present in the lumbar spine and thoracic spine. The quality of the pain is described as aching, burning, cramping, shooting and stabbing. Radiates to: right leg. The pain is at a severity of 5/10. The pain is severe. The pain is the same  all the time. The symptoms are aggravated by bending, lying down, position, sitting, standing and twisting. Stiffness is present in the morning. Associated symptoms include leg pain, numbness, tingling and weakness. Pertinent negatives include no abdominal pain. She has tried heat, muscle relaxant and bed rest for the symptoms. The treatment provided moderate relief.     Reports that now even the good parts of her back are hurting now.  Reports previous allergic reaction to Tizanidine - dry mouth, confusion, dizziness, pallor.  Declines prednisone.  She wants a back brace.  States this is impeding her going forward with Dr Dorothey Baseman next steps for cirrhosis.   Medications: Outpatient Medications Prior to Visit  Medication Sig  . Ascorbic Acid (VITAMIN C) 1000 MG tablet Take 1,000 mg by mouth daily.  . baclofen (LIORESAL) 10 MG tablet Take 1 tablet (10 mg total) by mouth 3 (three) times daily.  Marland Kitchen ELDERBERRY PO Take 1 tablet by mouth daily.  . Folic Acid (FOLATE PO) Take 1 tablet by mouth daily. (Patient not taking: No sig reported)  . furosemide (LASIX) 40 MG tablet TAKE 1 TABLET BY MOUTH EVERY DAY  . gabapentin (NEURONTIN) 400 MG capsule Take 1 capsule (400 mg total) by mouth 3 (three) times daily.  . Multiple Vitamin (MULTIVITAMIN WITH MINERALS) TABS tablet Take 1 tablet by mouth daily. Alive for Women  . Omega-3 Fatty Acids (FISH OIL) 1000 MG CAPS Take 1,000 mg by mouth daily.  Marland Kitchen oxyCODONE (OXY IR/ROXICODONE) 5 MG immediate release tablet Take 1 tablet (5 mg total) by mouth every 6 (six) hours as needed for severe pain.  Marland Kitchen PAPAYA ENZYME  PO Take 1 tablet by mouth 4 (four) times daily as needed (nausea/vomiting (with meals)). W/digestive enzymes.  Marland Kitchen spironolactone (ALDACTONE) 100 MG tablet TAKE 1 TABLET BY MOUTH EVERY DAY   No facility-administered medications prior to visit.    Review of Systems  Gastrointestinal: Negative for abdominal pain.  Musculoskeletal: Positive for back pain.   Neurological: Positive for tingling, weakness and numbness.      Objective    There were no vitals taken for this visit.   Physical Exam  Speaking in full sentences, tangential, tearful. Normal respiratory status.   Assessment & Plan     1. Chronic bilateral low back pain with right-sided sciatica - long standing issue with new flare and R sided sciatica - no red flags - patient declines prednisone - states that baclofen is not helpful, but "allergic" to other muscle relaxers - she saw ortho previously and was fitted for some back brace and wants to do this again - referral to Ortho placed today - Ambulatory referral to Orthopedic Surgery   She becomes agitated at end of visit. She is tired of talking to nurses and wants direct access to MD in the future. She wants to try a different muscle relaxer but says she is "allergic."  Discussed that other muscle relaxers are likely to have similar side effects and that baclofen minimizes these symptoms.   Return if symptoms worsen or fail to improve.     I discussed the assessment and treatment plan with the patient. The patient was provided an opportunity to ask questions and all were answered. The patient agreed with the plan and demonstrated an understanding of the instructions.   The patient was advised to call back or seek an in-person evaluation if the symptoms worsen or if the condition fails to improve as anticipated.  I provided 30 minutes of non-face-to-face time during this encounter.  I, Lavon Paganini, MD, have reviewed all documentation for this visit. The documentation on 08/18/20 for the exam, diagnosis, procedures, and orders are all accurate and complete.   Bacigalupo, Dionne Bucy, MD, MPH [facility]

## 2020-08-18 NOTE — Patient Instructions (Signed)

## 2020-08-19 ENCOUNTER — Telehealth: Payer: Self-pay

## 2020-08-19 NOTE — Telephone Encounter (Signed)
Pt notified of results via Mychart.  

## 2020-08-19 NOTE — Telephone Encounter (Signed)
-----   Message from [name], MD sent at 08/18/2020 11:33 AM EST ----- Let the patient know that she is immune to hepatitis A but needs a hepatitis B vaccine.  The patient's liver enzymes are still elevated and her labs suggest that there may be a autoimmune component.  The patient has been recently having problems with back pain and we should make sure that she is not taking anti-inflammatory medications that may be elevating her liver enzymes.  The patient should be set up for a liver biopsy after she gets over this acute illness of her back pain.

## 2020-08-22 ENCOUNTER — Telehealth: Payer: Self-pay

## 2020-08-22 NOTE — Telephone Encounter (Signed)
Copied from [place] [id number]. Topic: General - Inquiry >> Aug 22, 2020  3:15 PM Scherrie Gerlach wrote: Reason for CRM: 1. pt needs a hep B vaccine per Dr Allen Norris office. 2. Also pt wants Dr B to cancel the ortho referral.  She said she is going another route.  Pt ordered a full back brace off Internet.

## 2020-08-24 ENCOUNTER — Telehealth: Payer: Self-pay

## 2020-08-24 NOTE — Telephone Encounter (Signed)
Copied from [place] [id number]. Topic: General - Other >> Aug 24, 2020 11:07 AM Hinda Lenis D wrote: PT need hepatitis B vaccine today if possible / please advise

## 2020-09-23 ENCOUNTER — Other Ambulatory Visit: Payer: Self-pay | Admitting: Family Medicine

## 2020-09-23 NOTE — Telephone Encounter (Signed)
These have been being filled by Dr. Christian Mate

## 2020-09-23 NOTE — Telephone Encounter (Signed)
Requested medications are due for refill today yes  Requested medications are on the active medication list yes  Last refill 11/25  Last visit 07/2020  Future visit scheduled 10/2020  Notes to clinic Dr. Nancy Nordmann patient, these rx were written by a provider we do not approve rx for. Please assess.

## 2020-10-16 ENCOUNTER — Telehealth: Payer: Self-pay | Admitting: *Deleted

## 2020-10-16 NOTE — Telephone Encounter (Signed)
Called patient to inform her that it is time to schedule her annual lung cancer screening CT scan. Patient refused to have her CT scan scheduled.  She stated, "I do not want a CT scan every year because I will be exposed to so much radiation. Since my CT scan was normal last year, I do not feel the need of getting another one." Patient educated on the importance for the annual lung screening CT scans and she verbalized understanding, but still does not want a CT scan to be schedule at this time.

## 2020-10-18 ENCOUNTER — Telehealth: Payer: Self-pay

## 2020-10-18 NOTE — Telephone Encounter (Signed)
Patient called and appointment for the 28th w/Dr.B canceled. Patient will reschedule later. She did asked if she can have her labs done for hepatitis antibodies. She got two hep B and reports that she is due in May for the third one but was told by the GI doctor that she needs to have her antibodies check after the second dose. Please advise.

## 2020-10-18 NOTE — Telephone Encounter (Signed)
Would discuss with GI then. We will be getting her standard labs and evaluate need for others at her visit, whenever it ends up rescheduled for.

## 2020-10-21 NOTE — Telephone Encounter (Signed)
lmtcb

## 2020-10-27 NOTE — Progress Notes (Signed)
Subjective:   Monica Johnson is a 65 y.o. female who presents for Medicare Annual (Subsequent) preventive examination.  I connected with Monica Johnson today by telephone and verified that I am speaking with the correct person using two identifiers. Location patient: home Location provider: work Persons participating in the virtual visit: patient, provider.   I discussed the limitations, risks, security and privacy concerns of performing an evaluation and management service by telephone and the availability of in person appointments. I also discussed with the patient that there may be a patient responsible charge related to this service. The patient expressed understanding and verbally consented to this telephonic visit.    Interactive audio and video telecommunications were attempted between this provider and patient, however failed, due to patient having technical difficulties OR patient did not have access to video capability.  We continued and completed visit with audio only.   Review of Systems    N/A        Objective:    There were no vitals filed for this visit. There is no height or weight on file to calculate BMI.  Advanced Directives 06/01/2020 08/25/2019 11/19/2016 02/01/2016  Does Patient Have a Medical Advance Directive? No No No No  Does patient want to make changes to medical advance directive? No - Patient declined - - -  Would patient like information on creating a medical advance directive? No - Patient declined No - Patient declined - -    Current Medications (verified) Outpatient Encounter Medications as of 10/31/2020  Medication Sig  . furosemide (LASIX) 40 MG tablet TAKE 1 TABLET BY MOUTH EVERY DAY  . spironolactone (ALDACTONE) 100 MG tablet TAKE 1 TABLET BY MOUTH EVERY DAY  . Ascorbic Acid (VITAMIN C) 1000 MG tablet Take 1,000 mg by mouth daily.  . baclofen (LIORESAL) 10 MG tablet Take 1 tablet (10 mg total) by mouth 3 (three) times daily.  Marland Kitchen ELDERBERRY PO  Take 1 tablet by mouth daily.  . Folic Acid (FOLATE PO) Take 1 tablet by mouth daily. (Patient not taking: No sig reported)  . gabapentin (NEURONTIN) 400 MG capsule Take 1 capsule (400 mg total) by mouth 3 (three) times daily.  . Multiple Vitamin (MULTIVITAMIN WITH MINERALS) TABS tablet Take 1 tablet by mouth daily. Alive for Women  . Omega-3 Fatty Acids (FISH OIL) 1000 MG CAPS Take 1,000 mg by mouth daily.  Marland Kitchen oxyCODONE (OXY IR/ROXICODONE) 5 MG immediate release tablet Take 1 tablet (5 mg total) by mouth every 6 (six) hours as needed for severe pain.  Marland Kitchen PAPAYA ENZYME PO Take 1 tablet by mouth 4 (four) times daily as needed (nausea/vomiting (with meals)). W/digestive enzymes.   No facility-administered encounter medications on file as of 10/31/2020.    Allergies (verified) Aspirin and Sulfa antibiotics   History: Past Medical History:  Diagnosis Date  . Anemia    AGE 70  . Cervical stenosis of spine   . Cirrhosis ([place])   . Headache    H/O MIGRAINES  . Heart murmur    AS A CHILD-  . Rhinitis, chronic    Past Surgical History:  Procedure Laterality Date  . CATARACT EXTRACTION    . TUBAL LIGATION     Family History  Problem Relation Age of Onset  . Diabetes Mellitus II Mother   . Heart failure Mother   . Hypertension Mother   . Uterine cancer Mother   . Cancer Paternal Aunt        unknwon type  Social History   Socioeconomic History  . Marital status: Married    Spouse name: Not on file  . Number of children: 2  . Years of education: Not on file  . Highest education level: GED or equivalent  Occupational History  . Occupation: disabled  Tobacco Use  . Smoking status: Former Smoker    Packs/day: 0.50    Years: 39.00    Pack years: 19.50    Types: Cigarettes    Quit date: 05/19/2020    Years since quitting: 0.4  . Smokeless tobacco: Never Used  Vaping Use  . Vaping Use: Never used  Substance and Sexual Activity  . Alcohol use: Not Currently    Comment: a  glass of wine occasionally  . Drug use: No  . Sexual activity: Yes  Other Topics Concern  . Not on file  Social History Narrative  . Not on file   Social Determinants of Health   Financial Resource Strain: Not on file  Food Insecurity: Not on file  Transportation Needs: Not on file  Physical Activity: Not on file  Stress: Not on file  Social Connections: Not on file    Tobacco Counseling Counseling given: Not Answered   Clinical Intake:                 Diabetic? No         Activities of Daily Living In your present state of health, do you have any difficulty performing the following activities: 06/13/2020 05/26/2020  Hearing? N N  Vision? N N  Difficulty concentrating or making decisions? N N  Walking or climbing stairs? N N  Dressing or bathing? N N  Doing errands, shopping? N N  Some recent data might be hidden    Patient Care Team: Virginia Crews, MD as PCP - General (Family Medicine)  Indicate any recent Medical Services you may have received from other than [facility] providers in the past year (date may be approximate).     Assessment:   This is a routine wellness examination for Tr[place].  Hearing/Vision screen No exam data present  Dietary issues and exercise activities discussed:    Goals    . Exercise 3x per week (30 min per time)     Recommend to exercise for 3 days a week for at least 30 minutes at a time.     . Quit Smoking     Recommend to continue efforts to reduce smoking habits until no longer smoking (Smoking Cessation literature attached to AVS).       Depression Screen PHQ 2/9 Scores 06/13/2020 08/25/2019 11/10/2018  PHQ - 2 Score 1 0 0  PHQ- 9 Score 4 - -    Fall Risk Fall Risk  06/13/2020 06/07/2020 05/19/2020 08/25/2019 11/10/2018  Falls in the past year? 0 0 0 0 0  Number falls in past yr: 0 0 0 0 -  Injury with Fall? 0 0 0 0 -  Risk for fall due to : No Fall Risks - - - -  Follow up Falls evaluation completed - - - -     FALL RISK PREVENTION PERTAINING TO THE HOME:  Any stairs in or around the home? Yes  If so, are there any without handrails? No  Home free of loose throw rugs in walkways, pet beds, electrical cords, etc? Yes  Adequate lighting in your home to reduce risk of falls? Yes   ASSISTIVE DEVICES UTILIZED TO PREVENT FALLS:  Life alert? No  Use  of a cane, walker or w/c? No  Grab bars in the bathroom? No  Shower chair or bench in shower? No  Elevated toilet seat or a handicapped toilet? No    Cognitive Function: Normal cognitive status assessed by observation by this Nurse Health Advisor. No abnormalities found.       6CIT Screen 08/25/2019  What Year? 0 points  What month? 0 points  What time? 0 points  Count back from 20 0 points  Months in reverse 0 points  Repeat phrase 2 points  Total Score 2    Immunizations Immunization History  Administered Date(s) Administered  . Hepb-cpg 08/24/2020, 09/27/2020    TDAP status: Due, Education has been provided regarding the importance of this vaccine. Advised may receive this vaccine at local pharmacy or Health Dept. Aware to provide a copy of the vaccination record if obtained from local pharmacy or Health Dept. Verbalized acceptance and understanding.  Flu Vaccine status: Declined, Education has been provided regarding the importance of this vaccine but patient still declined. Advised may receive this vaccine at local pharmacy or Health Dept. Aware to provide a copy of the vaccination record if obtained from local pharmacy or Health Dept. Verbalized acceptance and understanding.  Pneumococcal vaccine status: Declined,  Education has been provided regarding the importance of this vaccine but patient still declined. Advised may receive this vaccine at local pharmacy or Health Dept. Aware to provide a copy of the vaccination record if obtained from local pharmacy or Health Dept. Verbalized acceptance and understanding.   Covid-19 vaccine  status: Declined, Education has been provided regarding the importance of this vaccine but patient still declined. Advised may receive this vaccine at local pharmacy or Health Dept.or vaccine clinic. Aware to provide a copy of the vaccination record if obtained from local pharmacy or Health Dept. Verbalized acceptance and understanding.  Qualifies for Shingles Vaccine? Yes   Zostavax completed No   Shingrix Completed?: No.    Education has been provided regarding the importance of this vaccine. Patient has been advised to call insurance company to determine out of pocket expense if they have not yet received this vaccine. Advised may also receive vaccine at local pharmacy or Health Dept. Verbalized acceptance and understanding.  Screening Tests Health Maintenance  Topic Date Due  . TETANUS/TDAP  Never done  . COLONOSCOPY (Pts 45-63yrs Insurance coverage will need to be confirmed)  Never done  . MAMMOGRAM  Never done  . DEXA SCAN  Never done  . PNA vac Low Risk Adult (1 of 2 - PCV13) Never done  . INFLUENZA VACCINE  11/03/2020 (Originally 03/06/2020)  . COVID-19 Vaccine (1) 01/21/2021 (Originally 12/05/1960)  . Hepatitis C Screening  Completed  . HPV VACCINES  Aged Out    Health Maintenance  Health Maintenance Due  Topic Date Due  . TETANUS/TDAP  Never done  . COLONOSCOPY (Pts 45-46yrs Insurance coverage will need to be confirmed)  Never done  . MAMMOGRAM  Never done  . DEXA SCAN  Never done  . PNA vac Low Risk Adult (1 of 2 - PCV13) Never done    Colorectal cancer screening: Type of screening: Colonoscopy. Pt is awaiting to schedule a colonoscopy after receiving her Hep B vaccine.   Mammogram status: Currently due, declined order at this time.  Bone Density status: Ordered today. Pt provided with contact info and advised to call to schedule appt.  Lung Cancer Screening: (Low Dose CT Chest recommended if Age 15-80 years, 30 pack-year currently smoking  OR have quit w/in 15years.) does  qualify however declines order at this time.   Additional Screening:  Hepatitis C Screening: Up to date  Vision Screening: Recommended annual ophthalmology exams for early detection of glaucoma and other disorders of the eye. Is the patient up to date with their annual eye exam? No Who is the provider or what is the name of the office in which the patient attends annual eye exams? n/a If pt is not established with a provider, would they like to be referred to a provider to establish care? No .   Dental Screening: Recommended annual dental exams for proper oral hygiene  Community Resource Referral / Chronic Care Management: CRR required this visit?  No   CCM required this visit?  No      Plan:     I have personally reviewed and noted the following in the patient's chart:   . Medical and social history . Use of alcohol, tobacco or illicit drugs  . Current medications and supplements . Functional ability and status . Nutritional status . Physical activity . Advanced directives . List of other physicians . Hospitalizations, surgeries, and ER visits in previous 12 months . Vitals . Screenings to include cognitive, depression, and falls . Referrals and appointments  In addition, I have reviewed and discussed with patient certain preventive protocols, quality metrics, and best practice recommendations. A written personalized care plan for preventive services as well as general preventive health recommendations were provided to patient.     Jagar Lua [place], [place]   5/40/0867   Nurse Notes: Pt declined receiving any future vaccines. Pt declined a mammogram order at this time. Pt is waiting to finish her Hep B vaccine series before scheduling a colonoscopy with Dr Allen Norris.

## 2020-10-31 ENCOUNTER — Ambulatory Visit: Payer: Self-pay | Admitting: Family Medicine

## 2020-10-31 ENCOUNTER — Other Ambulatory Visit: Payer: Self-pay

## 2020-10-31 ENCOUNTER — Ambulatory Visit (INDEPENDENT_AMBULATORY_CARE_PROVIDER_SITE_OTHER): Payer: Medicare Other

## 2020-10-31 DIAGNOSIS — Z0000 Encounter for general adult medical examination without abnormal findings: Secondary | ICD-10-CM

## 2020-10-31 DIAGNOSIS — E2839 Other primary ovarian failure: Secondary | ICD-10-CM | POA: Diagnosis not present

## 2020-10-31 NOTE — Patient Instructions (Signed)
Monica Johnson , Thank you for taking time to come for your Medicare Wellness Visit. I appreciate your ongoing commitment to your health goals. Please review the following plan we discussed and let me know if I can assist you in the future.   Screening recommendations/referrals: Colonoscopy: Currently due, awaiting vaccine completion before procedure. Mammogram: Currently due, declined receiving. Bone Density: Ordered today. Pt aware office will contact her to schedule apt.  Recommended yearly ophthalmology/optometry visit for glaucoma screening and checkup Recommended yearly dental visit for hygiene and checkup  Vaccinations: Influenza vaccine: Currently due, declined receiving.  Pneumococcal vaccine: Currently due, declined receiving.  Tdap vaccine: Currently due, declined receiving.  Shingles vaccine: Shingrix discussed. Please contact your pharmacy for coverage information.     Advanced directives: Advance directive discussed with you today. Even though you declined this today please call our office should you change your mind and we can give you the proper paperwork for you to fill out.  Conditions/risks identified: Smoking cessation discussed today.  Next appointment: 11/08/20 @ 2:20 PM with Dr Brita Romp    Preventive Care 65 Years and Older, Female Preventive care refers to lifestyle choices and visits with your health care provider that can promote health and wellness. What does preventive care include?  A yearly physical exam. This is also called an annual well check.  Dental exams once or twice a year.  Routine eye exams. Ask your health care provider how often you should have your eyes checked.  Personal lifestyle choices, including:  Daily care of your teeth and gums.  Regular physical activity.  Eating a healthy diet.  Avoiding tobacco and drug use.  Limiting alcohol use.  Practicing safe sex.  Taking low-dose aspirin every day.  Taking vitamin and mineral  supplements as recommended by your health care provider. What happens during an annual well check? The services and screenings done by your health care provider during your annual well check will depend on your age, overall health, lifestyle risk factors, and family history of disease. Counseling  Your health care provider may ask you questions about your:  Alcohol use.  Tobacco use.  Drug use.  Emotional well-being.  Home and relationship well-being.  Sexual activity.  Eating habits.  History of falls.  Memory and ability to understand (cognition).  Work and work Statistician.  Reproductive health. Screening  You may have the following tests or measurements:  Height, weight, and BMI.  Blood pressure.  Lipid and cholesterol levels. These may be checked every 5 years, or more frequently if you are over 82 years old.  Skin check.  Lung cancer screening. You may have this screening every year starting at age 101 if you have a 30-pack-year history of smoking and currently smoke or have quit within the past 15 years.  Fecal occult blood test (FOBT) of the stool. You may have this test every year starting at age 71.  Flexible sigmoidoscopy or colonoscopy. You may have a sigmoidoscopy every 5 years or a colonoscopy every 10 years starting at age 80.  Hepatitis C blood test.  Hepatitis B blood test.  Sexually transmitted disease (STD) testing.  Diabetes screening. This is done by checking your blood sugar (glucose) after you have not eaten for a while (fasting). You may have this done every 1-3 years.  Bone density scan. This is done to screen for osteoporosis. You may have this done starting at age 63.  Mammogram. This may be done every 1-2 years. Talk to your health care  provider about how often you should have regular mammograms. Talk with your health care provider about your test results, treatment options, and if necessary, the need for more tests. Vaccines  Your  health care provider may recommend certain vaccines, such as:  Influenza vaccine. This is recommended every year.  Tetanus, diphtheria, and acellular pertussis (Tdap, Td) vaccine. You may need a Td booster every 10 years.  Zoster vaccine. You may need this after age 47.  Pneumococcal 13-valent conjugate (PCV13) vaccine. One dose is recommended after age 95.  Pneumococcal polysaccharide (PPSV23) vaccine. One dose is recommended after age 82. Talk to your health care provider about which screenings and vaccines you need and how often you need them. This information is not intended to replace advice given to you by your health care provider. Make sure you discuss any questions you have with your health care provider. Document Released: 08/19/2015 Document Revised: 04/11/2016 Document Reviewed: 05/24/2015 Elsevier Interactive Patient Education  2017 [place] Prevention in the Home Falls can cause injuries. They can happen to people of all ages. There are many things you can do to make your home safe and to help prevent falls. What can I do on the outside of my home?  Regularly fix the edges of walkways and driveways and fix any cracks.  Remove anything that might make you trip as you walk through a door, such as a raised step or threshold.  Trim any bushes or trees on the path to your home.  Use bright outdoor lighting.  Clear any walking paths of anything that might make someone trip, such as rocks or tools.  Regularly check to see if handrails are loose or broken. Make sure that both sides of any steps have handrails.  Any raised decks and porches should have guardrails on the edges.  Have any leaves, snow, or ice cleared regularly.  Use sand or salt on walking paths during winter.  Clean up any spills in your garage right away. This includes oil or grease spills. What can I do in the bathroom?  Use night lights.  Install grab bars by the toilet and in the tub and  shower. Do not use towel bars as grab bars.  Use non-skid mats or decals in the tub or shower.  If you need to sit down in the shower, use a plastic, non-slip stool.  Keep the floor dry. Clean up any water that spills on the floor as soon as it happens.  Remove soap buildup in the tub or shower regularly.  Attach bath mats securely with double-sided non-slip rug tape.  Do not have throw rugs and other things on the floor that can make you trip. What can I do in the bedroom?  Use night lights.  Make sure that you have a light by your bed that is easy to reach.  Do not use any sheets or blankets that are too big for your bed. They should not hang down onto the floor.  Have a firm chair that has side arms. You can use this for support while you get dressed.  Do not have throw rugs and other things on the floor that can make you trip. What can I do in the kitchen?  Clean up any spills right away.  Avoid walking on wet floors.  Keep items that you use a lot in easy-to-reach places.  If you need to reach something above you, use a strong step stool that has a grab bar.  Keep electrical cords out of the way.  Do not use floor polish or wax that makes floors slippery. If you must use wax, use non-skid floor wax.  Do not have throw rugs and other things on the floor that can make you trip. What can I do with my stairs?  Do not leave any items on the stairs.  Make sure that there are handrails on both sides of the stairs and use them. Fix handrails that are broken or loose. Make sure that handrails are as long as the stairways.  Check any carpeting to make sure that it is firmly attached to the stairs. Fix any carpet that is loose or worn.  Avoid having throw rugs at the top or bottom of the stairs. If you do have throw rugs, attach them to the floor with carpet tape.  Make sure that you have a light switch at the top of the stairs and the bottom of the stairs. If you do not  have them, ask someone to add them for you. What else can I do to help prevent falls?  Wear shoes that:  Do not have high heels.  Have rubber bottoms.  Are comfortable and fit you well.  Are closed at the toe. Do not wear sandals.  If you use a stepladder:  Make sure that it is fully opened. Do not climb a closed stepladder.  Make sure that both sides of the stepladder are locked into place.  Ask someone to hold it for you, if possible.  Clearly mark and make sure that you can see:  Any grab bars or handrails.  First and last steps.  Where the edge of each step is.  Use tools that help you move around (mobility aids) if they are needed. These include:  Canes.  Walkers.  Scooters.  Crutches.  Turn on the lights when you go into a dark area. Replace any light bulbs as soon as they burn out.  Set up your furniture so you have a clear path. Avoid moving your furniture around.  If any of your floors are uneven, fix them.  If there are any pets around you, be aware of where they are.  Review your medicines with your doctor. Some medicines can make you feel dizzy. This can increase your chance of falling. Ask your doctor what other things that you can do to help prevent falls. This information is not intended to replace advice given to you by your health care provider. Make sure you discuss any questions you have with your health care provider. Document Released: 05/19/2009 Document Revised: 12/29/2015 Document Reviewed: 08/27/2014 Elsevier Interactive Patient Education  2017 [employer].

## 2020-11-08 ENCOUNTER — Other Ambulatory Visit: Payer: Self-pay

## 2020-11-08 ENCOUNTER — Encounter: Payer: Self-pay | Admitting: Family Medicine

## 2020-11-08 ENCOUNTER — Ambulatory Visit (INDEPENDENT_AMBULATORY_CARE_PROVIDER_SITE_OTHER): Payer: Medicare Other | Admitting: Family Medicine

## 2020-11-08 VITALS — BP 110/60 | HR 78 | Temp 98.7°F | Resp 16 | Ht 62.0 in | Wt 173.5 lb

## 2020-11-08 DIAGNOSIS — Z1231 Encounter for screening mammogram for malignant neoplasm of breast: Secondary | ICD-10-CM

## 2020-11-08 DIAGNOSIS — R188 Other ascites: Secondary | ICD-10-CM | POA: Diagnosis not present

## 2020-11-08 DIAGNOSIS — E669 Obesity, unspecified: Secondary | ICD-10-CM

## 2020-11-08 DIAGNOSIS — L732 Hidradenitis suppurativa: Secondary | ICD-10-CM

## 2020-11-08 DIAGNOSIS — F17200 Nicotine dependence, unspecified, uncomplicated: Secondary | ICD-10-CM

## 2020-11-08 DIAGNOSIS — K7460 Unspecified cirrhosis of liver: Secondary | ICD-10-CM

## 2020-11-08 DIAGNOSIS — I700 Atherosclerosis of aorta: Secondary | ICD-10-CM | POA: Diagnosis not present

## 2020-11-08 DIAGNOSIS — Z6831 Body mass index (BMI) 31.0-31.9, adult: Secondary | ICD-10-CM | POA: Diagnosis not present

## 2020-11-08 NOTE — Patient Instructions (Addendum)
Preventive Care 67 Years and Older, Female Preventive care refers to lifestyle choices and visits with your health care provider that can promote health and wellness. This includes:  A yearly physical exam. This is also called an annual wellness visit.  Regular dental and eye exams.  Immunizations.  Screening for certain conditions.  Healthy lifestyle choices, such as: ? Eating a healthy diet. ? Getting regular exercise. ? Not using drugs or products that contain nicotine and tobacco. ? Limiting alcohol use. What can I expect for my preventive care visit? Physical exam Your health care provider will check your:  Height and weight. These may be used to calculate your BMI (body mass index). BMI is a measurement that tells if you are at a healthy weight.  Heart rate and blood pressure.  Body temperature.  Skin for abnormal spots. Counseling Your health care provider may ask you questions about your:  Past medical problems.  Family's medical history.  Alcohol, tobacco, and drug use.  Emotional well-being.  Home life and relationship well-being.  Sexual activity.  Diet, exercise, and sleep habits.  History of falls.  Memory and ability to understand (cognition).  Work and work Statistician.  Pregnancy and menstrual history.  Access to firearms. What immunizations do I need? Vaccines are usually given at various ages, according to a schedule. Your health care provider will recommend vaccines for you based on your age, medical history, and lifestyle or other factors, such as travel or where you work.   What tests do I need? Blood tests  Lipid and cholesterol levels. These may be checked every 5 years, or more often depending on your overall health.  Hepatitis C test.  Hepatitis B test. Screening  Lung cancer screening. You may have this screening every year starting at age 74 if you have a 30-pack-year history of smoking and currently smoke or have quit within  the past 15 years.  Colorectal cancer screening. ? All adults should have this screening starting at age 44 and continuing until age 58. ? Your health care provider may recommend screening at age 2 if you are at increased risk. ? You will have tests every 1-10 years, depending on your results and the type of screening test.  Diabetes screening. ? This is done by checking your blood sugar (glucose) after you have not eaten for a while (fasting). ? You may have this done every 1-3 years.  Mammogram. ? This may be done every 1-2 years. ? Talk with your health care provider about how often you should have regular mammograms.  Abdominal aortic aneurysm (AAA) screening. You may need this if you are a current or former smoker.  BRCA-related cancer screening. This may be done if you have a family history of breast, ovarian, tubal, or peritoneal cancers. Other tests  STD (sexually transmitted disease) testing, if you are at risk.  Bone density scan. This is done to screen for osteoporosis. You may have this done starting at age 104. Talk with your health care provider about your test results, treatment options, and if necessary, the need for more tests. Follow these instructions at home: Eating and drinking  Eat a diet that includes fresh fruits and vegetables, whole grains, lean protein, and low-fat dairy products. Limit your intake of foods with high amounts of sugar, saturated fats, and salt.  Take vitamin and mineral supplements as recommended by your health care provider.  Do not drink alcohol if your health care provider tells you not to drink.  If you drink alcohol: ? Limit how much you have to 0-1 drink a day. ? Be aware of how much alcohol is in your drink. In the [place] one drink equals one 12 oz bottle of beer (355 mL), one 5 oz glass of wine (148 mL), or one 1 oz glass of hard liquor (44 mL).   Lifestyle  Take daily care of your teeth and gums. Brush your teeth every morning  and night with fluoride toothpaste. Floss one time each day.  Stay active. Exercise for at least 30 minutes 5 or more days each week.  Do not use any products that contain nicotine or tobacco, such as cigarettes, e-cigarettes, and chewing tobacco. If you need help quitting, ask your health care provider.  Do not use drugs.  If you are sexually active, practice safe sex. Use a condom or other form of protection in order to prevent STIs (sexually transmitted infections).  Talk with your health care provider about taking a low-dose aspirin or statin.  Find healthy ways to cope with stress, such as: ? Meditation, yoga, or listening to music. ? Journaling. ? Talking to a trusted person. ? Spending time with friends and family. Safety  Always wear your seat belt while driving or riding in a vehicle.  Do not drive: ? If you have been drinking alcohol. Do not ride with someone who has been drinking. ? When you are tired or distracted. ? While texting.  Wear a helmet and other protective equipment during sports activities.  If you have firearms in your house, make sure you follow all gun safety procedures. What's next?  Visit your health care provider once a year for an annual wellness visit.  Ask your health care provider how often you should have your eyes and teeth checked.  Stay up to date on all vaccines. This information is not intended to replace advice given to you by your health care provider. Make sure you discuss any questions you have with your health care provider. Document Revised: 07/13/2020 Document Reviewed: 07/17/2018 Elsevier Patient Education  2021 [employer]   Hidradenitis Suppurativa Hidradenitis suppurativa is a long-term (chronic) skin disease. It is similar to a severe form of acne, but it affects areas of the body where acne would be unusual, especially areas of the body where skin rubs against skin and becomes moist. These  include: Underarms. Groin. Genital area. Buttocks. Upper thighs. Breasts. Hidradenitis suppurativa may start out as small lumps or pimples caused by blocked sweat glands or hair follicles. Pimples may develop into deep sores that break open (rupture) and drain pus. Over time, affected areas of skin may thicken and become scarred. This condition is rare and does not spread from person to person (non-contagious). What are the causes? The exact cause of this condition is not known. It may be related to: Female and female hormones. An overactive disease-fighting system (immune system). The immune system may over-react to blocked hair follicles or sweat glands and cause swelling and pus-filled sores. What increases the risk? You are more likely to develop this condition if you: Are female. Are 52-51 years old. Have a family history of hidradenitis suppurativa. Have a personal history of acne. Are overweight. Smoke. Take the medicine lithium. What are the signs or symptoms? The first symptoms are usually painful bumps in the skin, similar to pimples. The condition may get worse over time (progress), or it may only cause mild symptoms. If the disease progresses, symptoms may include: Skin bumps getting  bigger and growing deeper into the skin. Bumps rupturing and draining pus. Itchy, infected skin. Skin getting thicker and scarred. Tunnels under the skin (fistulas) where pus drains from a bump. Pain during daily activities, such as pain during walking if your groin area is affected. Emotional problems, such as stress or depression. This condition may affect your appearance and your ability or willingness to wear certain clothes or do certain activities. How is this diagnosed? This condition is diagnosed by a health care provider who specializes in skin diseases (dermatologist). You may be diagnosed based on: Your symptoms and medical history. A physical exam. Testing a pus sample for  infection. Blood tests. How is this treated? Your treatment will depend on how severe your symptoms are. The same treatment will not work for everybody with this condition. You may need to try several treatments to find what works best for you. Treatment may include: Cleaning and bandaging (dressing) your wounds as needed. Lifestyle changes, such as new skin care routines. Taking medicines, such as: Antibiotics. Acne medicines. Medicines to reduce the activity of the immune system. A diabetes medicine (metformin). Birth control pills, for women. Steroids to reduce swelling and pain. Working with a mental health care provider, if you experience emotional distress due to this condition. If you have severe symptoms that do not get better with medicine, you may need surgery. Surgery may involve: Using a laser to clear the skin and remove hair follicles. Opening and draining deep sores. Removing the areas of skin that are diseased and scarred. Follow these instructions at home: Medicines Take over-the-counter and prescription medicines only as told by your health care provider. If you were prescribed an antibiotic medicine, take it as told by your health care provider. Do not stop taking the antibiotic even if your condition improves.   Skin care If you have open wounds, cover them with a clean dressing as told by your health care provider. Keep wounds clean by washing them gently with soap and water when you bathe. Do not shave the areas where you get hidradenitis suppurativa. Do not wear deodorant. Wear loose-fitting clothes. Try to avoid getting overheated or sweaty. If you get sweaty or wet, change into clean, dry clothes as soon as you can. To help relieve pain and itchiness, cover sore areas with a warm, clean washcloth (warm compress) for 5-10 minutes as often as needed. If told by your health care provider, take a bleach bath twice a week: Fill your bathtub halfway with water. Pour  in  cup of unscented household bleach. Soak in the tub for 5-10 minutes. Only soak from the neck down. Avoid water on your face and hair. Shower to rinse off the bleach from your skin. General instructions Learn as much as you can about your disease so that you have an active role in your treatment. Work closely with your health care provider to find treatments that work for you. If you are overweight, work with your health care provider to lose weight as recommended. Do not use any products that contain nicotine or tobacco, such as cigarettes and e-cigarettes. If you need help quitting, ask your health care provider. If you struggle with living with this condition, talk with your health care provider or work with a mental health care provider as recommended. Keep all follow-up visits as told by your health care provider. This is important. Where to find more information Hidradenitis [place].: https://www.hs-foundation.org/ American Academy of Dermatology: http://www.[place].com/ Contact a health care provider  if you have: A flare-up of hidradenitis suppurativa. A fever or chills. Trouble controlling your symptoms at home. Trouble doing your daily activities because of your symptoms. Trouble dealing with emotional problems related to your condition. Summary Hidradenitis suppurativa is a long-term (chronic) skin disease. It is similar to a severe form of acne, but it affects areas of the body where acne would be unusual. The first symptoms are usually painful bumps in the skin, similar to pimples. The condition may only cause mild symptoms, or it may get worse over time (progress). If you have open wounds, cover them with a clean dressing as told by your health care provider. Keep wounds clean by washing them gently with soap and water when you bathe. Besides skin care, treatment may include medicines, laser treatment, and surgery. This information is not intended to replace  advice given to you by your health care provider. Make sure you discuss any questions you have with your health care provider. Document Revised: 05/17/2020 Document Reviewed: 05/17/2020 Elsevier Patient Education  2021 [employer].

## 2020-11-08 NOTE — Progress Notes (Signed)
Established patient visit   Patient: Monica Johnson   DOB: 1955/09/08   65 y.o. Female  MRN: [id number] Visit Date: 11/08/2020  Today's healthcare provider: Lavon Paganini, MD   Chief Complaint  Patient presents with  . Follow-up   Subjective    HPI  10/31/2020 AWV  Upcoming colonoscopy/EGD with GI  She occasionally gets boils on inner thighs/groin. Does not have any currently. Finds that they happen more after having too many sweets Uses natural salve called CRID? - which is helpful  Patient Active Problem List   Diagnosis Date Noted  . Hidradenitis suppurativa 11/29/2020  . Aortic atherosclerosis ([place]) 11/08/2020  . Class 1 obesity without serious comorbidity with body mass index (BMI) of 31.0 to 31.9 in adult 11/08/2020  . Neck pain 07/25/2020  . COVID-19 vaccination declined 07/25/2020  . 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 Splenomegaly 06/13/2020  . S/P laparoscopic cholecystectomy 06/07/2020  . Cirrhosis of liver with ascites ([place]) 04/29/2020  . Liver lesion, left lobe 10/23/2019  . Cervical stenosis of spine 11/12/2018  . Chronic bilateral low back pain without sciatica 11/12/2018  . Tobacco use disorder 11/12/2018   Past Surgical History:  Procedure Laterality Date  . CATARACT EXTRACTION    . TUBAL LIGATION     Social History   Socioeconomic History  . Marital status: Married    Spouse name: Not on file  . Number of children: 2  . Years of education: Not on file  . Highest education level: GED or equivalent  Occupational History  . Occupation: disabled  Tobacco Use  . Smoking status: Current Every Day Smoker    Packs/day: 0.50    Years: 39.00    Pack years: 19.50    Types: Cigarettes    Last attempt to quit: 05/19/2020    Years since quitting: 0.5  . Smokeless tobacco: Never Used  Vaping Use  . Vaping Use: Never used  Substance and Sexual Activity  . Alcohol use: Not Currently  . Drug use: No  . Sexual activity: Yes  Other  Topics Concern  . Not on file  Social History Narrative  . Not on file   Social Determinants of Health   Financial Resource Strain: Low Risk   . Difficulty of Paying Living Expenses: Not hard at all  Food Insecurity: No Food Insecurity  . Worried About Charity fundraiser in the Last Year: Never true  . Ran Out of Food in the Last Year: Never true  Transportation Needs: No Transportation Needs  . Lack of Transportation (Medical): No  . Lack of Transportation (Non-Medical): No  Physical Activity: Inactive  . Days of Exercise per Week: 0 days  . Minutes of Exercise per Session: 0 min  Stress: No Stress Concern Present  . Feeling of Stress : Not at all  Social Connections: Moderately Isolated  . Frequency of Communication with Friends and Family: More than three times a week  . Frequency of Social Gatherings with Friends and Family: More than three times a week  . Attends Religious Services: Never  . Active Member of Clubs or Organizations: No  . Attends Archivist Meetings: Never  . Marital Status: Married  Human resources officer Violence: Not At Risk  . Fear of Current or Ex-Partner: No  . Emotionally Abused: No  . Physically Abused: No  . Sexually Abused: No   Family Status  Relation Name Status  . Mother  Deceased  . Ethlyn Daniels  (Not  Specified)  . Father  Deceased  . Sister  Alive  . Daughter  Alive  . Son  Deceased       car accident at age 50  . Sister  Alive  . Neg Hx  (Not Specified)   Family History  Problem Relation Age of Onset  . Diabetes Mellitus II Mother   . Heart failure Mother   . Hypertension Mother   . Uterine cancer Mother   . Cancer Paternal Aunt        unknwon type  . Sarcoidosis Daughter   . Breast cancer Neg Hx    Allergies  Allergen Reactions  . Aspirin Anxiety and Palpitations  . Sulfa Antibiotics Anxiety    Feels like bug crawling in hair/all over body       Medications: Outpatient Medications Prior to Visit  Medication Sig   . Ascorbic Acid (VITAMIN C) 1000 MG tablet Take 1,000 mg by mouth daily.  Marland Kitchen ELDERBERRY PO Take 1 tablet by mouth daily.  . furosemide (LASIX) 40 MG tablet TAKE 1 TABLET BY MOUTH EVERY DAY  . gabapentin (NEURONTIN) 400 MG capsule Take 1 capsule (400 mg total) by mouth 3 (three) times daily.  . Multiple Vitamin (MULTIVITAMIN WITH MINERALS) TABS tablet Take 1 tablet by mouth daily. Alive for Women  . Omega-3 Fatty Acids (FISH OIL) 1000 MG CAPS Take 1,000 mg by mouth daily.  Marland Kitchen PAPAYA ENZYME PO Take 1 tablet by mouth 4 (four) times daily as needed (nausea/vomiting (with meals)). W/digestive enzymes.  Marland Kitchen spironolactone (ALDACTONE) 100 MG tablet TAKE 1 TABLET BY MOUTH EVERY DAY  . oxyCODONE (OXY IR/ROXICODONE) 5 MG immediate release tablet Take 1 tablet (5 mg total) by mouth every 6 (six) hours as needed for severe pain. (Patient not taking: Reported on 11/08/2020)  . [DISCONTINUED] baclofen (LIORESAL) 10 MG tablet Take 1 tablet (10 mg total) by mouth 3 (three) times daily. (Patient not taking: Reported on 10/31/2020)  . [DISCONTINUED] Folic Acid (FOLATE PO) Take 1 tablet by mouth daily. (Patient not taking: No sig reported)   No facility-administered medications prior to visit.    Review of Systems  All other systems reviewed and are negative.   Last CBC Lab Results  Component Value Date   WBC 9.2 11/08/2020   HGB 14.1 11/08/2020   HCT 40.3 11/08/2020   MCV 95 11/08/2020   MCH 33.3 (H) 11/08/2020   RDW 13.2 11/08/2020   PLT 118 (L) 58/04/9832   Last metabolic panel Lab Results  Component Value Date   GLUCOSE 94 06/24/2020   NA 137 06/24/2020   K 4.4 06/24/2020   CL 100 06/24/2020   CO2 26 06/24/2020   BUN 15 06/24/2020   CREATININE 0.86 06/24/2020   GFRNONAA 71 06/24/2020   GFRAA 81 06/24/2020   CALCIUM 9.4 06/24/2020   PHOS 2.8 (L) 06/13/2020   PROT 8.1 08/09/2020   ALBUMIN 4.1 08/09/2020   LABGLOB 4.0 06/24/2020   AGRATIO 1.0 (L) 06/24/2020   BILITOT 0.6 08/09/2020    ALKPHOS 157 (H) 08/09/2020   AST 60 (H) 08/09/2020   ALT 39 (H) 08/09/2020   ANIONGAP 9 06/07/2020   Last lipids Lab Results  Component Value Date   CHOL 159 11/08/2020   HDL 53 11/08/2020   LDLCALC 90 11/08/2020   TRIG 86 11/08/2020   CHOLHDL 3.0 11/08/2020   Last hemoglobin A1c No results found for: HGBA1C Last thyroid functions No results found for: TSH, T3TOTAL, T4TOTAL, THYROIDAB Last vitamin D No results  found for: 25OHVITD2, 25OHVITD3, VD25OH Last vitamin B12 and Folate No results found for: VITAMINB12, FOLATE      Objective    BP 110/60 (BP Location: Left Arm, Patient Position: Sitting, Cuff Size: Large)   Pulse 78   Temp 98.7 F (37.1 C) (Oral)   Resp 16   Ht 5\' 2"  (1.575 m)   Wt 173 lb 8 oz (78.7 kg)   SpO2 98%   BMI 31.73 kg/m  BP Readings from Last 3 Encounters:  11/08/20 110/60  08/09/20 100/65  07/25/20 110/60   Wt Readings from Last 3 Encounters:  11/08/20 173 lb 8 oz (78.7 kg)  08/09/20 178 lb 9.6 oz (81 kg)  07/25/20 178 lb 8 oz (81 kg)      Physical Exam Vitals reviewed.  Constitutional:      General: She is not in acute distress.    Appearance: Normal appearance. She is well-developed. She is not diaphoretic.  HENT:     Head: Normocephalic and atraumatic.     Right Ear: Tympanic membrane, ear canal and external ear normal.     Left Ear: Tympanic membrane, ear canal and external ear normal.     Nose: Nose normal.     Mouth/Throat:     Mouth: Mucous membranes are moist.     Pharynx: Oropharynx is clear. No oropharyngeal exudate.  Eyes:     General: No scleral icterus.    Conjunctiva/sclera: Conjunctivae normal.     Pupils: Pupils are equal, round, and reactive to light.  Neck:     Thyroid: No thyromegaly.  Cardiovascular:     Rate and Rhythm: Normal rate and regular rhythm.     Pulses: Normal pulses.     Heart sounds: Normal heart sounds. No murmur heard.   Pulmonary:     Effort: Pulmonary effort is normal. No respiratory  distress.     Breath sounds: Normal breath sounds. No wheezing or rales.  Abdominal:     General: There is no distension.     Palpations: Abdomen is soft.     Tenderness: There is no abdominal tenderness.  Musculoskeletal:        General: No deformity.     Cervical back: Neck supple.     Right lower leg: No edema.     Left lower leg: No edema.  Lymphadenopathy:     Cervical: No cervical adenopathy.  Skin:    General: Skin is warm and dry.     Comments: No active boils or signs of infection, but old scarring with sinus tracts reminiscent of hidradenitis  Neurological:     Mental Status: She is alert and oriented to person, place, and time. Mental status is at baseline.     Sensory: No sensory deficit.     Motor: No weakness.     Gait: Gait normal.  Psychiatric:        Mood and Affect: Mood normal.        Behavior: Behavior normal.        Thought Content: Thought content normal.       Results for orders placed or performed in visit on 11/08/20  Lipid panel  Result Value Ref Range   Cholesterol, Total 159 100 - 199 mg/dL   Triglycerides 86 0 - 149 mg/dL   HDL 53 >39 mg/dL   VLDL Cholesterol Cal 16 5 - 40 mg/dL   LDL Chol Calc (NIH) 90 0 - 99 mg/dL   Chol/HDL Ratio 3.0 0.0 - 4.4 ratio  CBC  Result Value Ref Range   WBC 9.2 3.4 - 10.8 x10E3/uL   RBC 4.23 3.77 - 5.28 x10E6/uL   Hemoglobin 14.1 11.1 - 15.9 g/dL   Hematocrit 40.3 34.0 - 46.6 %   MCV 95 79 - 97 fL   MCH 33.3 (H) 26.6 - 33.0 pg   MCHC 35.0 31.5 - 35.7 g/dL   RDW 13.2 11.7 - 15.4 %   Platelets 118 (L) 150 - 450 x10E3/uL    Assessment & Plan     Problem List Items Addressed This Visit      Cardiovascular and Mediastinum   Aortic atherosclerosis ([place])    Noted on imaging previously Check lipid panel and calculate ASCVD risk and consider statin for secondary prevention        Digestive   Cirrhosis of liver with ascites ([place])    Thought to be secondary to [employer] Avoid hepatotoxic substances Followed  by GI Ascites is improving Continue current meds      Relevant Orders   CBC (Completed)     Musculoskeletal and Integument   Hidradenitis suppurativa - Primary    New diagnosis No active infection or boils that required I&D Referral to dermatology for consideration of biologic Discussed avoiding tight fitting clothing and keeping the area clean and dry as possible      Relevant Orders   Ambulatory referral to Dermatology     Other   Tobacco use disorder    Discussed importance of cessation and risks of ongoing smoking Precontemplative and not interested in quitting at this time      Class 1 obesity without serious comorbidity with body mass index (BMI) of 31.0 to 31.9 in adult    Discussed importance of healthy weight management Discussed diet and exercise       Relevant Orders   Lipid panel (Completed)    Other Visit Diagnoses    Encounter for screening mammogram for malignant neoplasm of breast       Relevant Orders   MM 3D SCREEN BREAST BILATERAL (Completed)       Return in about 1 year (around 11/08/2021) for CPE, AWV.      I, Lavon Paganini, MD, have reviewed all documentation for this visit. The documentation on 11/29/20 for the exam, diagnosis, procedures, and orders are all accurate and complete.   Saoirse Legere, Dionne Bucy, MD, MPH [facility]

## 2020-11-09 LAB — LIPID PANEL
Chol/HDL Ratio: 3 ratio (ref 0.0–4.4)
Cholesterol, Total: 159 mg/dL (ref 100–199)
HDL: 53 mg/dL (ref >39)
LDL Chol Calc (NIH): 90 mg/dL (ref 0–99)
Triglycerides: 86 mg/dL (ref 0–149)
VLDL Cholesterol Cal: 16 mg/dL (ref 5–40)

## 2020-11-09 LAB — CBC
Hematocrit: 40.3 % (ref 34.0–46.6)
Hemoglobin: 14.1 g/dL (ref 11.1–15.9)
MCH: 33.3 pg — ABNORMAL HIGH (ref 26.6–33.0)
MCHC: 35 g/dL (ref 31.5–35.7)
MCV: 95 fL (ref 79–97)
Platelets: 118 10*3/uL — ABNORMAL LOW (ref 150–450)
RBC: 4.23 x10E6/uL (ref 3.77–5.28)
RDW: 13.2 % (ref 11.7–15.4)
WBC: 9.2 10*3/uL (ref 3.4–10.8)

## 2020-11-16 ENCOUNTER — Ambulatory Visit
Admission: RE | Admit: 2020-11-16 | Discharge: 2020-11-16 | Disposition: A | Discharge status: Home / Self Care | Payer: Medicare Other | Source: Ambulatory Visit | Attending: Family Medicine | Admitting: Family Medicine

## 2020-11-16 ENCOUNTER — Other Ambulatory Visit: Payer: Self-pay

## 2020-11-16 DIAGNOSIS — E2839 Other primary ovarian failure: Secondary | ICD-10-CM | POA: Diagnosis not present

## 2020-11-16 DIAGNOSIS — Z1231 Encounter for screening mammogram for malignant neoplasm of breast: Secondary | ICD-10-CM | POA: Insufficient documentation

## 2020-11-16 DIAGNOSIS — Z780 Asymptomatic menopausal state: Secondary | ICD-10-CM | POA: Diagnosis not present

## 2020-11-29 ENCOUNTER — Encounter: Payer: Self-pay | Admitting: Family Medicine

## 2020-11-29 DIAGNOSIS — L732 Hidradenitis suppurativa: Secondary | ICD-10-CM | POA: Insufficient documentation

## 2020-11-29 NOTE — Assessment & Plan Note (Signed)
Thought to be secondary to [facility] Avoid hepatotoxic substances Followed by GI Ascites is improving Continue current meds

## 2020-11-29 NOTE — Assessment & Plan Note (Signed)
Noted on imaging previously Check lipid panel and calculate ASCVD risk and consider statin for secondary prevention

## 2020-11-29 NOTE — Assessment & Plan Note (Signed)
Discussed importance of cessation and risks of ongoing smoking Precontemplative and not interested in quitting at this time

## 2020-11-29 NOTE — Assessment & Plan Note (Signed)
New diagnosis No active infection or boils that required I&D Referral to dermatology for consideration of biologic Discussed avoiding tight fitting clothing and keeping the area clean and dry as possible

## 2020-11-29 NOTE — Assessment & Plan Note (Signed)
Discussed importance of healthy weight management Discussed diet and exercise  

## 2020-12-19 ENCOUNTER — Other Ambulatory Visit: Payer: Self-pay | Admitting: Family Medicine

## 2021-01-10 ENCOUNTER — Other Ambulatory Visit: Payer: Self-pay

## 2021-01-10 DIAGNOSIS — K7460 Unspecified cirrhosis of liver: Secondary | ICD-10-CM

## 2021-01-10 DIAGNOSIS — R188 Other ascites: Secondary | ICD-10-CM

## 2021-01-13 NOTE — Progress Notes (Signed)
Patient on schedule for Liver biopsy 01/17/2021, called and spoke with patient on phone with pre procedure instructions given. Made aware to be here @ 0730, NPO after MN prior to procedure, and driver post procedure/recovery/discharge, stated understanding.

## 2021-01-16 ENCOUNTER — Other Ambulatory Visit: Payer: Self-pay | Admitting: Radiology

## 2021-01-16 NOTE — H&P (Signed)
Chief Complaint: Patient was seen in consultation today for non-focal liver biopsy   Referring Physician(s): [place]  Supervising Physician: Arne Cleveland  Patient Status: [facility] - Out-pt  History of Present Illness: Monica Johnson is a 65 y.o. female with a medical history significant for hypertension, anemia, cholecystectomy and cirrhosis. She is followed by Gastroenterology for cirrhosis and has had ascites requiring diuretic therapy. Imaging shows a nodular liver consistent with cirrhosis and signs of portal hypertension. Labs work up was negative for acute hepatitis but positive for consistently elevated liver enzymes.   Interventional Radiology has been asked to evaluate this patient for an image-guided non-focal liver biopsy for further work up.   Past Medical History:  Diagnosis Date   Anemia    AGE 65   Cervical stenosis of spine    Cirrhosis ([facility])    Headache    H/O MIGRAINES   Heart murmur    AS A CHILD-   Hypertension    Rhinitis, chronic     Past Surgical History:  Procedure Laterality Date   CATARACT EXTRACTION     TUBAL LIGATION      Allergies: Aspirin and Sulfa antibiotics  Medications: Prior to Admission medications   Medication Sig Start Date End Date Taking? Authorizing Provider  Ascorbic Acid (VITAMIN C) 1000 MG tablet Take 1,000 mg by mouth daily.    [provider]  ELDERBERRY PO Take 1 tablet by mouth daily.    [provider]  furosemide (LASIX) 40 MG tablet TAKE 1 TABLET BY MOUTH EVERY DAY 12/19/20   Bacigalupo, Dionne Bucy, MD  gabapentin (NEURONTIN) 400 MG capsule Take 1 capsule (400 mg total) by mouth 3 (three) times daily. 04/29/20   Virginia Crews, MD  Multiple Vitamin (MULTIVITAMIN WITH MINERALS) TABS tablet Take 1 tablet by mouth daily. Alive for Women    [provider]  Omega-3 Fatty Acids (FISH OIL) 1000 MG CAPS Take 1,000 mg by mouth daily.    [provider]  oxyCODONE (OXY  IR/ROXICODONE) 5 MG immediate release tablet Take 1 tablet (5 mg total) by mouth every 6 (six) hours as needed for severe pain. Patient not taking: Reported on 11/08/2020 06/01/20   Ronny Bacon, MD  PAPAYA ENZYME PO Take 1 tablet by mouth 4 (four) times daily as needed (nausea/vomiting (with meals)). W/digestive enzymes.    [provider]  spironolactone (ALDACTONE) 100 MG tablet TAKE 1 TABLET BY MOUTH EVERY DAY 09/23/20   Bacigalupo, Dionne Bucy, MD     Family History  Problem Relation Age of Onset   Diabetes Mellitus II Mother    Heart failure Mother    Hypertension Mother    Uterine cancer Mother    Cancer Paternal Aunt        unknwon type   Sarcoidosis Daughter    Breast cancer Neg Hx     Social History   Socioeconomic History   Marital status: Married    Spouse name: Not on file   Number of children: 2   Years of education: Not on file   Highest education level: GED or equivalent  Occupational History   Occupation: disabled  Tobacco Use   Smoking status: Every Day    Packs/day: 0.50    Years: 39.00    Pack years: 19.50    Types: Cigarettes    Last attempt to quit: 05/19/2020    Years since quitting: 0.6   Smokeless tobacco: Never  Vaping Use   Vaping Use: Never used  Substance and Sexual Activity   Alcohol use: Not Currently   Drug use: No   Sexual activity: Yes  Other Topics Concern   Not on file  Social History Narrative   Not on file   Social Determinants of Health   Financial Resource Strain: Low Risk    Difficulty of Paying Living Expenses: Not hard at all  Food Insecurity: No Food Insecurity   Worried About Charity fundraiser in the Last Year: Never true   [place] in the Last Year: Never true  Transportation Needs: No Transportation Needs   Lack of Transportation (Medical): No   Lack of Transportation (Non-Medical): No  Physical Activity: Inactive   Days of Exercise per Week: 0 days   Minutes of Exercise per Session: 0 min   Stress: No Stress Concern Present   Feeling of Stress : Not at all  Social Connections: Moderately Isolated   Frequency of Communication with Friends and Family: More than three times a week   Frequency of Social Gatherings with Friends and Family: More than three times a week   Attends Religious Services: Never   Marine scientist or Organizations: No   Attends Archivist Meetings: Never   Marital Status: Married    Review of Systems: A 12 point ROS discussed and pertinent positives are indicated in the HPI above.  All other systems are negative.  Review of Systems  Constitutional:  Negative for appetite change and fatigue.  Respiratory:  Negative for cough and shortness of breath.   Cardiovascular:  Negative for chest pain and leg swelling.  Gastrointestinal:  Negative for abdominal pain, diarrhea, nausea and vomiting.  Neurological:  Negative for dizziness and headaches.   Vital Signs: BP (!) 108/57   Pulse 79   Temp 97.8 F (36.6 C) (Oral)   Resp 20   Ht 5\' 2"  (1.575 m)   Wt 169 lb (76.7 kg)   SpO2 98%   BMI 30.91 kg/m   Physical Exam Constitutional:      General: She is not in acute distress. HENT:     Mouth/Throat:     Mouth: Mucous membranes are moist.     Pharynx: Oropharynx is clear.  Cardiovascular:     Rate and Rhythm: Normal rate and regular rhythm.     Pulses: Normal pulses.     Heart sounds: Normal heart sounds.  Pulmonary:     Effort: Pulmonary effort is normal.     Breath sounds: Normal breath sounds.  Abdominal:     General: Bowel sounds are normal.     Palpations: Abdomen is soft.     Tenderness: There is no abdominal tenderness.  Musculoskeletal:     Right lower leg: No edema.     Left lower leg: No edema.  Skin:    General: Skin is warm and dry.  Neurological:     Mental Status: She is alert and oriented to person, place, and time.    Imaging: No results found.  Labs:  CBC: Recent Labs    05/19/20 1026  06/06/20 1304 11/08/20 1521  WBC 6.0 9.7 9.2  HGB 12.6 12.3 14.1  HCT 36.8 37.2 40.3  PLT 91* 120* 118*    COAGS: Recent Labs    05/27/20 1116 06/06/20 1304  INR 1.1 1.1    BMP: Recent Labs    05/19/20 1026 06/06/20 1304 06/07/20 1557 06/13/20 1435 06/24/20 1050  NA 143 136 135 136 137  K 4.1 4.3  4.2 4.3 4.4  CL 105 103 101 98 100  CO2 23 25 25 28 26   GLUCOSE 83 78 146* 86 94  BUN 10 8 9 9 15   CALCIUM 9.1 8.8* 8.4* 9.0 9.4  CREATININE 0.75 0.75 0.83 0.92 0.86  GFRNONAA 83 >60 >60 65 71  GFRAA 96  --   --  75 81    LIVER FUNCTION TESTS: Recent Labs    05/19/20 1026 06/06/20 1304 06/13/20 1435 06/24/20 1050 08/09/20 1539  BILITOT 0.7 1.2  --  0.7 0.6  AST 53* 56*  --  71* 60*  ALT 29 35  --  39* 39*  ALKPHOS 147* 101  --  149* 157*  PROT 7.2 6.8  --  7.9 8.1  ALBUMIN 3.6* 2.9* 3.4* 3.9 4.1    TUMOR MARKERS: No results for input(s): AFPTM, CEA, CA199, CHROMGRNA in the last 8760 hours.  Assessment and Plan:  Elevated liver enzymes: Monica Johnson, 64 year old female, presents today to the [facility] Interventional Radiology department for an image-guided non-focal liver biopsy for evaluation of elevated liver enzymes.  Risks and benefits of this procedure were discussed with the patient and/or patient's family including, but not limited to bleeding, infection, damage to adjacent structures or low yield requiring additional tests.  All of the questions were answered and there is agreement to proceed. She has been NPO. Vitals have been reviewed. Labs are pending but will be reviewed prior to the start of the procedure.   Consent signed and in chart.  Thank you for this interesting consult.  I greatly enjoyed meeting Monica Johnson and look forward to participating in their care.  A copy of this report was sent to the requesting provider on this date.  Electronically Signed: Soyla Dryer, AGACNP-BC [phone] 01/17/2021, 8:06  AM   I spent a total of  30 Minutes   in face to face in clinical consultation, greater than 50% of which was counseling/coordinating care for non-focal liver biopsy

## 2021-01-17 ENCOUNTER — Ambulatory Visit
Admission: RE | Admit: 2021-01-17 | Discharge: 2021-01-17 | Disposition: A | Discharge status: Home / Self Care | Payer: Medicare Other | Source: Ambulatory Visit | Attending: Gastroenterology | Admitting: Gastroenterology

## 2021-01-17 ENCOUNTER — Other Ambulatory Visit: Payer: Self-pay

## 2021-01-17 DIAGNOSIS — F17210 Nicotine dependence, cigarettes, uncomplicated: Secondary | ICD-10-CM | POA: Diagnosis not present

## 2021-01-17 DIAGNOSIS — R945 Abnormal results of liver function studies: Secondary | ICD-10-CM | POA: Diagnosis not present

## 2021-01-17 DIAGNOSIS — K7460 Unspecified cirrhosis of liver: Secondary | ICD-10-CM | POA: Insufficient documentation

## 2021-01-17 DIAGNOSIS — R748 Abnormal levels of other serum enzymes: Secondary | ICD-10-CM | POA: Diagnosis present

## 2021-01-17 DIAGNOSIS — I10 Essential (primary) hypertension: Secondary | ICD-10-CM | POA: Diagnosis not present

## 2021-01-17 LAB — CBC
HCT: 39.4 % (ref 36.0–46.0)
Hemoglobin: 13.6 g/dL (ref 12.0–15.0)
MCH: 33.3 pg (ref 26.0–34.0)
MCHC: 34.5 g/dL (ref 30.0–36.0)
MCV: 96.3 fL (ref 80.0–100.0)
Platelets: 110 10*3/uL — ABNORMAL LOW (ref 150–400)
RBC: 4.09 MIL/uL (ref 3.87–5.11)
RDW: 13.9 % (ref 11.5–15.5)
WBC: 8.2 10*3/uL (ref 4.0–10.5)
nRBC: 0 % (ref 0.0–0.2)

## 2021-01-17 LAB — PROTIME-INR
INR: 1.1 (ref 0.8–1.2)
Prothrombin Time: 13.9 seconds (ref 11.4–15.2)

## 2021-01-17 MED ORDER — FENTANYL CITRATE (PF) 100 MCG/2ML IJ SOLN
INTRAMUSCULAR | Status: AC
  Filled 2021-01-17: qty 2

## 2021-01-17 MED ORDER — HYDROCODONE-ACETAMINOPHEN 5-325 MG PO TABS: 1.0000 | ORAL_TABLET | ORAL | Status: DC | PRN

## 2021-01-17 MED ORDER — MIDAZOLAM HCL 5 MG/5ML IJ SOLN
INTRAMUSCULAR | Status: AC
  Filled 2021-01-17: qty 5

## 2021-01-17 MED ORDER — SODIUM CHLORIDE 0.9 % IV SOLN: INTRAVENOUS | Status: DC

## 2021-01-17 MED ORDER — FENTANYL CITRATE (PF) 100 MCG/2ML IJ SOLN
INTRAMUSCULAR | Status: AC | PRN
  Administered 2021-01-17: 50 ug via INTRAVENOUS

## 2021-01-17 MED ORDER — MIDAZOLAM HCL 5 MG/5ML IJ SOLN
INTRAMUSCULAR | Status: AC | PRN
  Administered 2021-01-17: 1 mg via INTRAVENOUS

## 2021-01-20 ENCOUNTER — Ambulatory Visit: Payer: Self-pay

## 2021-01-20 NOTE — Telephone Encounter (Signed)
Patient called and says she has a sore throat and want an antibiotic called in. I advised she will need to be evaluated by a provider and possibly a throat swab. She says she woke up this morning with a sore throat, red with pus on the tonsils, tonsils swollen. She says she can feel the tonsils at the back of her tongue. She says the sore throat doesn't interfere with eating, but she feels it could get worse. She says she is not running a fever, no SOB, no other symptoms. I advised no availability with PCP or any provider in the office, advised to go to the UC or e-visit. She says she will go to the UC, care advice given.  Reason for Disposition  [1] Pus on tonsils (back of throat) AND [2]  fever AND [3] swollen neck lymph nodes ("glands")  Answer Assessment - Initial Assessment Questions 1. ONSET: "When did the throat start hurting?" (Hours or days ago)      This morning 2. SEVERITY: "How bad is the sore throat?" (Scale 1-10; mild, moderate or severe)   - MILD (1-3):  doesn't interfere with eating or normal activities   - MODERATE (4-7): interferes with eating some solids and normal activities   - SEVERE (8-10):  excruciating pain, interferes with most normal activities   - SEVERE DYSPHAGIA: can't swallow liquids, drooling     Mild 3. STREP EXPOSURE: "Has there been any exposure to strep within the past week?" If Yes, ask: "What type of contact occurred?"      No 4.  VIRAL SYMPTOMS: "Are there any symptoms of a cold, such as a runny nose, cough, hoarse voice or red eyes?"      No 5. FEVER: "Do you have a fever?" If Yes, ask: "What is your temperature, how was it measured, and when did it start?"     No 6. PUS ON THE TONSILS: "Is there pus on the tonsils in the back of your throat?"     Yes, see white patches 7. OTHER SYMPTOMS: "Do you have any other symptoms?" (e.g., difficulty breathing, headache, rash)     No 8. PREGNANCY: "Is there any chance you are pregnant?" "When was your last  menstrual period?"     No  Protocols used: Sore Throat-A-AH

## 2021-01-24 LAB — SURGICAL PATHOLOGY

## 2021-02-03 ENCOUNTER — Other Ambulatory Visit: Payer: Self-pay

## 2021-02-03 DIAGNOSIS — Z1211 Encounter for screening for malignant neoplasm of colon: Secondary | ICD-10-CM

## 2021-02-03 DIAGNOSIS — Z13810 Encounter for screening for upper gastrointestinal disorder: Secondary | ICD-10-CM

## 2021-02-03 MED ORDER — SUPREP BOWEL PREP KIT 17.5-3.13-1.6 GM/177ML PO SOLN: 1.0000 | ORAL | 0 refills | Status: AC

## 2021-02-09 ENCOUNTER — Other Ambulatory Visit: Payer: Self-pay

## 2021-02-09 ENCOUNTER — Ambulatory Visit (INDEPENDENT_AMBULATORY_CARE_PROVIDER_SITE_OTHER): Payer: Medicare Other | Admitting: Gastroenterology

## 2021-02-09 ENCOUNTER — Other Ambulatory Visit
Admission: RE | Admit: 2021-02-09 | Discharge: 2021-02-09 | Disposition: A | Discharge status: Home / Self Care | Payer: Medicare Other | Attending: Gastroenterology | Admitting: Gastroenterology

## 2021-02-09 ENCOUNTER — Encounter: Payer: Self-pay | Admitting: Gastroenterology

## 2021-02-09 VITALS — BP 113/65 | HR 76 | Temp 98.5°F | Ht 62.0 in | Wt 169.1 lb

## 2021-02-09 DIAGNOSIS — R188 Other ascites: Secondary | ICD-10-CM

## 2021-02-09 DIAGNOSIS — K7460 Unspecified cirrhosis of liver: Secondary | ICD-10-CM | POA: Insufficient documentation

## 2021-02-09 LAB — CBC WITH DIFFERENTIAL/PLATELET
Abs Immature Granulocytes: 0.01 10*3/uL (ref 0.00–0.07)
Basophils Absolute: 0.1 10*3/uL (ref 0.0–0.1)
Basophils Relative: 1 %
Eosinophils Absolute: 0.2 10*3/uL (ref 0.0–0.5)
Eosinophils Relative: 3 %
HCT: 37.5 % (ref 36.0–46.0)
Hemoglobin: 12.9 g/dL (ref 12.0–15.0)
Immature Granulocytes: 0 %
Lymphocytes Relative: 25 %
Lymphs Abs: 1.9 10*3/uL (ref 0.7–4.0)
MCH: 32.7 pg (ref 26.0–34.0)
MCHC: 34.4 g/dL (ref 30.0–36.0)
MCV: 95.2 fL (ref 80.0–100.0)
Monocytes Absolute: 0.7 10*3/uL (ref 0.1–1.0)
Monocytes Relative: 10 %
Neutro Abs: 4.5 10*3/uL (ref 1.7–7.7)
Neutrophils Relative %: 61 %
Platelets: 99 10*3/uL — ABNORMAL LOW (ref 150–400)
RBC: 3.94 MIL/uL (ref 3.87–5.11)
RDW: 14.1 % (ref 11.5–15.5)
WBC: 7.4 10*3/uL (ref 4.0–10.5)
nRBC: 0 % (ref 0.0–0.2)

## 2021-02-09 LAB — HEPATIC FUNCTION PANEL
ALT: 36 U/L (ref 0–44)
AST: 51 U/L — ABNORMAL HIGH (ref 15–41)
Albumin: 3.8 g/dL (ref 3.5–5.0)
Alkaline Phosphatase: 107 U/L (ref 38–126)
Bilirubin, Direct: 0.2 mg/dL (ref 0.0–0.2)
Indirect Bilirubin: 0.7 mg/dL (ref 0.3–0.9)
Total Bilirubin: 0.9 mg/dL (ref 0.3–1.2)
Total Protein: 7.5 g/dL (ref 6.5–8.1)

## 2021-02-09 NOTE — H&P (View-Only) (Signed)
Primary Care Physician: Virginia Crews, MD  Primary Gastroenterologist:  Dr. [name]  Chief Complaint  Patient presents with  . Follow up liver biopsy results    HPI: Monica Johnson is a 65 y.o. female here for follow-up of her liver biopsy.  The patient liver biopsy did not show any cause for the abnormal liver enzymes but did mention that there was no fatty liver seen and that many different causes could be accounting for the pathology results that were seen by the pathologist.  The patient has a positive ANA and a positive SMA.  At her visit in January the patient was recommended to undergo an EGD and colonoscopy.  The upper endoscopy was to look for varices with her history of cirrhosis and the colonoscopy was for screening purposes.  It does not appear that the patient had those test done. The patient had liver enzymes that were significantly elevated 6 months ago with an AST of 60 and ALT of 39 with an alk phosphatase of 157.  The patient reports that she stopped taking her Aldactone and Lasix because of reading about the side effects and she believes that these medications can lead to liver failure and GYN cancers.  She also reports that these medications can also decrease her platelet count.  She has been taking folic acid to help with her platelet count. The patient's daughter stated that there is multiple family members with autoimmune diseases and the daughter is on CellCept at the present time for an autoimmune disease.  Past Medical History:  Diagnosis Date  . Anemia    AGE 34  . Cervical stenosis of spine   . Cirrhosis ([place])   . Headache    H/O MIGRAINES  . Heart murmur    AS A CHILD-  . Hypertension   . Rhinitis, chronic     Current Outpatient Medications  Medication Sig Dispense Refill  . Ascorbic Acid (VITAMIN C) 1000 MG tablet Take 1,000 mg by mouth daily.    Marland Kitchen ELDERBERRY PO Take 1 tablet by mouth daily.    . folic acid (FOLVITE) 473 MCG tablet Take  400 mcg by mouth daily.    . furosemide (LASIX) 40 MG tablet TAKE 1 TABLET BY MOUTH EVERY DAY 90 tablet 0  . gabapentin (NEURONTIN) 400 MG capsule Take 1 capsule (400 mg total) by mouth 3 (three) times daily. 90 capsule 3  . Multiple Vitamin (MULTIVITAMIN WITH MINERALS) TABS tablet Take 1 tablet by mouth daily. Alive for Women    . Na Sulfate-K Sulfate-Mg Sulf (SUPREP BOWEL PREP KIT) 17.5-3.13-1.6 GM/177ML SOLN Take 1 kit by mouth as directed. 354 mL 0  . Omega-3 Fatty Acids (FISH OIL) 1000 MG CAPS Take 1,000 mg by mouth daily.    Marland Kitchen spironolactone (ALDACTONE) 100 MG tablet TAKE 1 TABLET BY MOUTH EVERY DAY 90 tablet 1  . oxyCODONE (OXY IR/ROXICODONE) 5 MG immediate release tablet Take 1 tablet (5 mg total) by mouth every 6 (six) hours as needed for severe pain. (Patient not taking: No sig reported) 15 tablet 0  . PAPAYA ENZYME PO Take 1 tablet by mouth 4 (four) times daily as needed (nausea/vomiting (with meals)). W/digestive enzymes. (Patient not taking: No sig reported)     No current facility-administered medications for this visit.    Allergies as of 02/09/2021 - Review Complete 02/09/2021  Allergen Reaction Noted  . Aspirin Anxiety and Palpitations 02/01/2016  . Sulfa antibiotics Anxiety 02/01/2016    ROS:  General: Negative for anorexia, weight loss, fever, chills, fatigue, weakness. ENT: Negative for hoarseness, difficulty swallowing , nasal congestion. CV: Negative for chest pain, angina, palpitations, dyspnea on exertion, peripheral edema.  Respiratory: Negative for dyspnea at rest, dyspnea on exertion, cough, sputum, wheezing.  GI: See history of present illness. GU:  Negative for dysuria, hematuria, urinary incontinence, urinary frequency, nocturnal urination.  Endo: Negative for unusual weight change.    Physical Examination:   BP 113/65 (BP Location: Left Arm, Patient Position: Sitting, Cuff Size: Normal)   Pulse 76   Temp 98.5 F (36.9 C) (Temporal)   Ht 5' 2" (1.575 m)   Wt 169 lb 1.6 oz  (76.7 kg)   BMI 30.93 kg/m   General: Well-nourished, well-developed in no acute distress.  Eyes: No icterus. Conjunctivae pink. Neuro: Alert and oriented x 3.  Grossly intact. Skin: Warm and dry, no jaundice.   Psych: Alert and cooperative, normal mood and affect.  Labs:    Imaging Studies: US BIOPSY (LIVER)  Result Date: 01/17/2021 CLINICAL DATA:  Abnormal LFTs.  CT suggests cirrhosis. EXAM: ULTRASOUND-GUIDED CORE LIVER BIOPSY TECHNIQUE: An ultrasound guided liver biopsy was thoroughly discussed with the patient and questions were answered. The benefits, risks, alternatives, and complications were also discussed. The patient understands and wishes to proceed with the procedure. A verbal as well as written consent was obtained. Survey ultrasound of the liver was performed and an appropriate skin entry site was determined. Skin site was marked, prepped with Betadine, and draped in usual sterile fashion, and infiltrated locally with 1% lidocaine. Intravenous Fentanyl 50mcg and Versed 1mg were administered as conscious sedation during continuous monitoring of the patient's level of consciousness and physiological / cardiorespiratory status by the radiology RN, with a total moderate sedation time of 7 minutes. A 17 gauge trocar needle was advanced under ultrasound guidance into the liver. 3 solid-appearing coaxial 18gauge core samples were then obtained through the guide needle. The guide needle was removed. Post procedure scans demonstrate no apparent complication. COMPLICATIONS: COMPLICATIONS None immediate FINDINGS: No focal liver lesion or biliary ductal dilatation noted on survey imaging. Representative core biopsy samples obtained as above. IMPRESSION: 1. Technically successful ultrasound guided core liver biopsy. Electronically Signed   By: D  Hassell M.D.   On: 01/17/2021 12:56    Assessment and Plan:   Monica Johnson is a 65 y.o. y/o female who comes in today for follow-up of her liver  biopsy.  The patient has since stopped taking her Aldactone and Lasix because she believes the side effects include worsening of her liver disease, worsening of her platelet count and can lead to GYN cancers. The patient was assured that these are not issues we see with patients taking these medications.  The patient has also been informed that since her water retention has completely resolved I do not recommend she take these medications at this time anyway.  The patient's daughter was on the phone during the entire encounter. The patient has also been told that the liver biopsy was inconclusive and that since her AMA and SMA were elevated that she may need to be started on immunosuppressive therapy such as Imuran or steroids if her liver enzymes continue to remain elevated.  Her response was "So, your Ginny pig and you're just going to experiment on me?" The patient was reassured that she has a choice of whether to take treatment or not and that she was not being experimented on but we were pursuing the most logical   course of action since she has cirrhosis with elevated liver enzymes and positive autoimmune markers.  The patient has agreed to have her liver enzymes checked again as well as her CBC.  She has been told that if her liver enzymes are coming down that we may continue to follow her and not recommend any immunosuppressants at this time since she is verbally against taking these medications for a multitude of reasons expressed by her including the unwanted weight gain.  The patient will be notified of the labs when they return.    [name], MD. Marval Regal    Note: This dictation was prepared with Dragon dictation along with smaller phrase technology. Any transcriptional errors that result from this process are unintentional.

## 2021-02-09 NOTE — Progress Notes (Signed)
Primary Care Physician: Virginia Crews, MD  Primary Gastroenterologist:  Dr. [name]  Chief Complaint  Patient presents with   Follow up liver biopsy results    HPI: Monica Johnson is a 65 y.o. female here for follow-up of her liver biopsy.  The patient liver biopsy did not show any cause for the abnormal liver enzymes but did mention that there was no fatty liver seen and that many different causes could be accounting for the pathology results that were seen by the pathologist.  The patient has a positive ANA and a positive SMA.  At her visit in January the patient was recommended to undergo an EGD and colonoscopy.  The upper endoscopy was to look for varices with her history of cirrhosis and the colonoscopy was for screening purposes.  It does not appear that the patient had those test done. The patient had liver enzymes that were significantly elevated 6 months ago with an AST of 60 and ALT of 39 with an alk phosphatase of 157.  The patient reports that she stopped taking her Aldactone and Lasix because of reading about the side effects and she believes that these medications can lead to liver failure and GYN cancers.  She also reports that these medications can also decrease her platelet count.  She has been taking folic acid to help with her platelet count. The patient's daughter stated that there is multiple family members with autoimmune diseases and the daughter is on CellCept at the present time for an autoimmune disease.  Past Medical History:  Diagnosis Date   Anemia    AGE 65   Cervical stenosis of spine    Cirrhosis ([facility])    Headache    H/O MIGRAINES   Heart murmur    AS A CHILD-   Hypertension    Rhinitis, chronic     Current Outpatient Medications  Medication Sig Dispense Refill   Ascorbic Acid (VITAMIN C) 1000 MG tablet Take 1,000 mg by mouth daily.     ELDERBERRY PO Take 1 tablet by mouth daily.     folic acid (FOLVITE) 161 MCG tablet Take 400 mcg by  mouth daily.     furosemide (LASIX) 40 MG tablet TAKE 1 TABLET BY MOUTH EVERY DAY 90 tablet 0   gabapentin (NEURONTIN) 400 MG capsule Take 1 capsule (400 mg total) by mouth 3 (three) times daily. 90 capsule 3   Multiple Vitamin (MULTIVITAMIN WITH MINERALS) TABS tablet Take 1 tablet by mouth daily. Alive for Women     Na Sulfate-K Sulfate-Mg Sulf (SUPREP BOWEL PREP KIT) 17.5-3.13-1.6 GM/177ML SOLN Take 1 kit by mouth as directed. 354 mL 0   Omega-3 Fatty Acids (FISH OIL) 1000 MG CAPS Take 1,000 mg by mouth daily.     spironolactone (ALDACTONE) 100 MG tablet TAKE 1 TABLET BY MOUTH EVERY DAY 90 tablet 1   oxyCODONE (OXY IR/ROXICODONE) 5 MG immediate release tablet Take 1 tablet (5 mg total) by mouth every 6 (six) hours as needed for severe pain. (Patient not taking: No sig reported) 15 tablet 0   PAPAYA ENZYME PO Take 1 tablet by mouth 4 (four) times daily as needed (nausea/vomiting (with meals)). W/digestive enzymes. (Patient not taking: No sig reported)     No current facility-administered medications for this visit.    Allergies as of 02/09/2021 - Review Complete 02/09/2021  Allergen Reaction Noted   Aspirin Anxiety and Palpitations 02/01/2016   Sulfa antibiotics Anxiety 02/01/2016    ROS:  General: Negative for anorexia, weight loss, fever, chills, fatigue, weakness. ENT: Negative for hoarseness, difficulty swallowing , nasal congestion. CV: Negative for chest pain, angina, palpitations, dyspnea on exertion, peripheral edema.  Respiratory: Negative for dyspnea at rest, dyspnea on exertion, cough, sputum, wheezing.  GI: See history of present illness. GU:  Negative for dysuria, hematuria, urinary incontinence, urinary frequency, nocturnal urination.  Endo: Negative for unusual weight change.    Physical Examination:   BP 113/65 (BP Location: Left Arm, Patient Position: Sitting, Cuff Size: Normal)   Pulse 76   Temp 98.5 F (36.9 C) (Temporal)   Ht 5' 2"  (1.575 m)   Wt 169 lb 1.6 oz  (76.7 kg)   BMI 30.93 kg/m   General: Well-nourished, well-developed in no acute distress.  Eyes: No icterus. Conjunctivae pink. Neuro: Alert and oriented x 3.  Grossly intact. Skin: Warm and dry, no jaundice.   Psych: Alert and cooperative, normal mood and affect.  Labs:    Imaging Studies: US BIOPSY (LIVER)  Result Date: 01/17/2021 CLINICAL DATA:  Abnormal LFTs.  CT suggests cirrhosis. EXAM: ULTRASOUND-GUIDED CORE LIVER BIOPSY TECHNIQUE: An ultrasound guided liver biopsy was thoroughly discussed with the patient and questions were answered. The benefits, risks, alternatives, and complications were also discussed. The patient understands and wishes to proceed with the procedure. A verbal as well as written consent was obtained. Survey ultrasound of the liver was performed and an appropriate skin entry site was determined. Skin site was marked, prepped with Betadine, and draped in usual sterile fashion, and infiltrated locally with 1% lidocaine. Intravenous Fentanyl 70mg and Versed 144mwere administered as conscious sedation during continuous monitoring of the patient's level of consciousness and physiological / cardiorespiratory status by the radiology RN, with a total moderate sedation time of 7 minutes. A 17 gauge trocar needle was advanced under ultrasound guidance into the liver. 3 solid-appearing coaxial 18gauge core samples were then obtained through the guide needle. The guide needle was removed. Post procedure scans demonstrate no apparent complication. COMPLICATIONS: COMPLICATIONS None immediate FINDINGS: No focal liver lesion or biliary ductal dilatation noted on survey imaging. Representative core biopsy samples obtained as above. IMPRESSION: 1. Technically successful ultrasound guided core liver biopsy. Electronically Signed   By: D Lucrezia Europe.D.   On: 01/17/2021 12:56    Assessment and Plan:   Monica Johnson a [age].o. y/o female who comes in today for follow-up of her liver  biopsy.  The patient has since stopped taking her Aldactone and Lasix because she believes the side effects include worsening of her liver disease, worsening of her platelet count and can lead to GYN cancers. The patient was assured that these are not issues we see with patients taking these medications.  The patient has also been informed that since her water retention has completely resolved I do not recommend she take these medications at this time anyway.  The patient's daughter was on the phone during the entire encounter. The patient has also been told that the liver biopsy was inconclusive and that since her AMA and SMA were elevated that she may need to be started on immunosuppressive therapy such as Imuran or steroids if her liver enzymes continue to remain elevated.  Her response was "So, your Ginny pig and you're just going to experiment on me?" The patient was reassured that she has a choice of whether to take treatment or not and that she was not being experimented on but we were pursuing the most logical  course of action since she has cirrhosis with elevated liver enzymes and positive autoimmune markers.  The patient has agreed to have her liver enzymes checked again as well as her CBC.  She has been told that if her liver enzymes are coming down that we may continue to follow her and not recommend any immunosuppressants at this time since she is verbally against taking these medications for a multitude of reasons expressed by her including the unwanted weight gain.  The patient will be notified of the labs when they return.    [name], MD. Marval Regal    Note: This dictation was prepared with Dragon dictation along with smaller phrase technology. Any transcriptional errors that result from this process are unintentional.

## 2021-02-10 ENCOUNTER — Telehealth: Payer: Self-pay

## 2021-02-10 NOTE — Telephone Encounter (Signed)
-----   Message from [name], MD sent at 02/09/2021  8:34 PM EDT ----- That the patient know that her platelets are lower now than they have been in the past at 99.  Her liver enzymes are greatly improved with now only one of her liver enzymes the AST being 51 when the upper limit of normal is 41.  I would recommend checking the liver enzymes again in 1 month to see if they continue to decrease.  At this time I would not recommend starting any medications.

## 2021-02-10 NOTE — Telephone Encounter (Signed)
Pt notified of lab results through mychart.  

## 2021-03-07 ENCOUNTER — Encounter: Payer: Self-pay | Admitting: Gastroenterology

## 2021-03-07 ENCOUNTER — Ambulatory Visit: Payer: Medicare Other | Admitting: Certified Registered"

## 2021-03-07 ENCOUNTER — Ambulatory Visit
Admission: RE | Admit: 2021-03-07 | Discharge: 2021-03-07 | Disposition: A | Discharge status: Home / Self Care | Payer: Medicare Other | Attending: Gastroenterology | Admitting: Gastroenterology

## 2021-03-07 ENCOUNTER — Encounter
Admission: RE | Disposition: A | Discharge status: Home / Self Care | Payer: Self-pay | Source: Home / Self Care | Attending: Gastroenterology

## 2021-03-07 DIAGNOSIS — Z886 Allergy status to analgesic agent status: Secondary | ICD-10-CM | POA: Insufficient documentation

## 2021-03-07 DIAGNOSIS — D123 Benign neoplasm of transverse colon: Secondary | ICD-10-CM | POA: Insufficient documentation

## 2021-03-07 DIAGNOSIS — Z882 Allergy status to sulfonamides status: Secondary | ICD-10-CM | POA: Insufficient documentation

## 2021-03-07 DIAGNOSIS — K766 Portal hypertension: Secondary | ICD-10-CM | POA: Insufficient documentation

## 2021-03-07 DIAGNOSIS — K641 Second degree hemorrhoids: Secondary | ICD-10-CM | POA: Diagnosis not present

## 2021-03-07 DIAGNOSIS — Z13810 Encounter for screening for upper gastrointestinal disorder: Secondary | ICD-10-CM | POA: Diagnosis not present

## 2021-03-07 DIAGNOSIS — I864 Gastric varices: Secondary | ICD-10-CM | POA: Insufficient documentation

## 2021-03-07 DIAGNOSIS — K7460 Unspecified cirrhosis of liver: Secondary | ICD-10-CM | POA: Diagnosis not present

## 2021-03-07 DIAGNOSIS — K7469 Other cirrhosis of liver: Secondary | ICD-10-CM

## 2021-03-07 DIAGNOSIS — D124 Benign neoplasm of descending colon: Secondary | ICD-10-CM | POA: Insufficient documentation

## 2021-03-07 DIAGNOSIS — Z87891 Personal history of nicotine dependence: Secondary | ICD-10-CM | POA: Insufficient documentation

## 2021-03-07 DIAGNOSIS — Z1211 Encounter for screening for malignant neoplasm of colon: Secondary | ICD-10-CM | POA: Insufficient documentation

## 2021-03-07 DIAGNOSIS — Z79899 Other long term (current) drug therapy: Secondary | ICD-10-CM | POA: Diagnosis not present

## 2021-03-07 DIAGNOSIS — D649 Anemia, unspecified: Secondary | ICD-10-CM | POA: Diagnosis not present

## 2021-03-07 DIAGNOSIS — K3189 Other diseases of stomach and duodenum: Secondary | ICD-10-CM | POA: Diagnosis not present

## 2021-03-07 DIAGNOSIS — K621 Rectal polyp: Secondary | ICD-10-CM | POA: Insufficient documentation

## 2021-03-07 DIAGNOSIS — D126 Benign neoplasm of colon, unspecified: Secondary | ICD-10-CM | POA: Diagnosis not present

## 2021-03-07 DIAGNOSIS — K5730 Diverticulosis of large intestine without perforation or abscess without bleeding: Secondary | ICD-10-CM | POA: Diagnosis not present

## 2021-03-07 DIAGNOSIS — K635 Polyp of colon: Secondary | ICD-10-CM

## 2021-03-07 HISTORY — PX: ESOPHAGOGASTRODUODENOSCOPY (EGD) WITH PROPOFOL: SHX5813

## 2021-03-07 HISTORY — PX: COLONOSCOPY WITH PROPOFOL: SHX5780

## 2021-03-07 SURGERY — COLONOSCOPY WITH PROPOFOL
Anesthesia: General

## 2021-03-07 MED ORDER — PROPOFOL 10 MG/ML IV BOLUS
INTRAVENOUS | Status: DC | PRN
  Administered 2021-03-07: 70 mg via INTRAVENOUS
  Administered 2021-03-07: 30 mg via INTRAVENOUS

## 2021-03-07 MED ORDER — SODIUM CHLORIDE 0.9 % IV SOLN: INTRAVENOUS | Status: DC

## 2021-03-07 MED ORDER — GLYCOPYRROLATE 0.2 MG/ML IJ SOLN
INTRAMUSCULAR | Status: DC | PRN
  Administered 2021-03-07: .2 mg via INTRAVENOUS

## 2021-03-07 MED ORDER — EPHEDRINE SULFATE 50 MG/ML IJ SOLN
INTRAMUSCULAR | Status: DC | PRN
  Administered 2021-03-07: 10 mg via INTRAVENOUS

## 2021-03-07 MED ORDER — PROPOFOL 500 MG/50ML IV EMUL
INTRAVENOUS | Status: DC | PRN
  Administered 2021-03-07: 120 ug/kg/min via INTRAVENOUS

## 2021-03-07 MED ORDER — PROPOFOL 500 MG/50ML IV EMUL
INTRAVENOUS | Status: AC
  Filled 2021-03-07: qty 50

## 2021-03-07 MED ORDER — MIDAZOLAM HCL 2 MG/2ML IJ SOLN
INTRAMUSCULAR | Status: AC
  Filled 2021-03-07: qty 2

## 2021-03-07 MED ORDER — LIDOCAINE 2% (20 MG/ML) 5 ML SYRINGE
INTRAMUSCULAR | Status: DC | PRN
  Administered 2021-03-07: 25 mg via INTRAVENOUS

## 2021-03-07 NOTE — Op Note (Signed)
[facility] Gastroenterology Patient Name: Monica Johnson Procedure Date: 03/07/2021 9:15 AM MRN: [id number] Account #: [id number] Date of Birth: [birth year] Admit Type: Outpatient Age: 65 Room: [facility] ENDO ROOM 4 Gender: Female Note Status: Finalized Procedure:             Upper GI endoscopy Indications:           Cirrhosis rule out esophageal varices Providers:             [name] MD, MD Referring MD:          Dionne Bucy. Bacigalupo (Referring MD) Medicines:             Propofol per Anesthesia Complications:         No immediate complications. Procedure:             Pre-Anesthesia Assessment:                        - Prior to the procedure, a History and Physical was                         performed, and patient medications and allergies were                         reviewed. The patient's tolerance of previous                         anesthesia was also reviewed. The risks and benefits                         of the procedure and the sedation options and risks                         were discussed with the patient. All questions were                         answered, and informed consent was obtained. Prior                         Anticoagulants: The patient has taken no previous                         anticoagulant or antiplatelet agents. ASA Grade                         Assessment: II - A patient with mild systemic disease.                         After reviewing the risks and benefits, the patient                         was deemed in satisfactory condition to undergo the                         procedure.                        After obtaining informed consent, the endoscope was  passed under direct vision. Throughout the procedure,                         the patient's blood pressure, pulse, and oxygen                         saturations were monitored continuously. The Endoscope                         was introduced through the  mouth, and advanced to the                         second part of duodenum. The upper GI endoscopy was                         accomplished without difficulty. The patient tolerated                         the procedure well. Findings:      The examined esophagus was normal.      Type 1 isolated gastric varices (IGV1, varices located in the fundus)       with no bleeding were found in the gastric fundus. There were no       stigmata of recent bleeding.      Moderate portal hypertensive gastropathy was found in the entire       examined stomach.      The examined duodenum was normal. Impression:            - Normal esophagus.                        - Type 1 isolated gastric varices (IGV1, varices                         located in the fundus), without bleeding.                        - Portal hypertensive gastropathy.                        - Normal examined duodenum.                        - No specimens collected. Recommendation:        - Discharge patient to home.                        - Resume previous diet.                        - Continue present medications.                        - Perform a colonoscopy today. Procedure Code(s):     --- Professional ---                        [phone], Esophagogastroduodenoscopy, flexible,                         transoral; diagnostic, including collection of  specimen(s) by brushing or washing, when performed                         (separate procedure) Diagnosis Code(s):     --- Professional ---                        K74.60, Unspecified cirrhosis of liver                        K76.6, Portal hypertension                        I86.4, Gastric varices CPT copyright 2019 [employer]. All rights reserved. The codes documented in this report are preliminary and upon coder review may  be revised to meet current compliance requirements. [name] MD, MD 03/07/2021 9:36:51 AM This report has been signed  electronically. Number of Addenda: 0 Note Initiated On: 03/07/2021 9:15 AM Estimated Blood Loss:  Estimated blood loss: none.      [facility]

## 2021-03-07 NOTE — Interval H&P Note (Signed)
[name], MD [address] [place], [place] [address] [phone] Fax : [phone]  Primary Care Physician:  Virginia Crews, MD Primary Gastroenterologist:  Dr. Allen Norris  Pre-Procedure History & Physical: HPI:  Monica Johnson is a 65 y.o. female is here for an endoscopy and colonoscopy.   Past Medical History:  Diagnosis Date   Anemia    AGE 70   Cervical stenosis of spine    Cirrhosis (HCC)    Headache    H/O MIGRAINES   Heart murmur    AS A CHILD-   Hypertension    Rhinitis, chronic     Past Surgical History:  Procedure Laterality Date   CATARACT EXTRACTION     LIVER BIOPSY     TUBAL LIGATION      Prior to Admission medications   Medication Sig Start Date End Date Taking? Authorizing Provider  Ascorbic Acid (VITAMIN C) 1000 MG tablet Take 1,000 mg by mouth daily.   Yes [provider]  ELDERBERRY PO Take 1 tablet by mouth daily.   Yes [provider]  folic acid (FOLVITE) 568 MCG tablet Take 400 mcg by mouth daily.   Yes [provider]  Multiple Vitamin (MULTIVITAMIN WITH MINERALS) TABS tablet Take 1 tablet by mouth daily. Alive for Women   Yes [provider]  Na Sulfate-K Sulfate-Mg Sulf (SUPREP BOWEL PREP KIT) 17.5-3.13-1.6 GM/177ML SOLN Take 1 kit by mouth as directed. 02/03/21  Yes [name], MD  Probiotic Product (PROBIOTIC ADVANCED PO) Take by mouth.   Yes [provider]  furosemide (LASIX) 40 MG tablet TAKE 1 TABLET BY MOUTH EVERY DAY 12/19/20   Bacigalupo, Dionne Bucy, MD  gabapentin (NEURONTIN) 400 MG capsule Take 1 capsule (400 mg total) by mouth 3 (three) times daily. 04/29/20   Bacigalupo, Dionne Bucy, MD  Omega-3 Fatty Acids (FISH OIL) 1000 MG CAPS Take 1,000 mg by mouth daily.    [provider]  oxyCODONE (OXY IR/ROXICODONE) 5 MG immediate release tablet Take 1 tablet (5 mg total) by mouth every 6 (six) hours as needed for severe pain. Patient not taking: No sig reported  06/01/20   Ronny Bacon, MD  PAPAYA ENZYME PO Take 1 tablet by mouth 4 (four) times daily as needed (nausea/vomiting (with meals)). W/digestive enzymes.    [provider]  spironolactone (ALDACTONE) 100 MG tablet TAKE 1 TABLET BY MOUTH EVERY DAY 09/23/20   Virginia Crews, MD    Allergies as of 02/03/2021 - Review Complete 01/17/2021  Allergen Reaction Noted   Aspirin Anxiety and Palpitations 02/01/2016   Sulfa antibiotics Anxiety 02/01/2016    Family History  Problem Relation Age of Onset   Diabetes Mellitus II Mother    Heart failure Mother    Hypertension Mother    Uterine cancer Mother    Cancer Paternal Aunt        unknwon type   Sarcoidosis Daughter    Breast cancer Neg Hx     Social History   Socioeconomic History   Marital status: Married    Spouse name: Not on file   Number of children: 2   Years of education: Not on file   Highest education level: GED or equivalent  Occupational History   Occupation: disabled  Tobacco Use   Smoking status: Every Day    Packs/day: 0.50    Years: 39.00    Pack years: 19.50    Types: Cigarettes    Last attempt to quit: 05/19/2020  Years since quitting: 0.8   Smokeless tobacco: Never  Vaping Use   Vaping Use: Never used  Substance and Sexual Activity   Alcohol use: Not Currently   Drug use: No   Sexual activity: Yes  Other Topics Concern   Not on file  Social History Narrative   Not on file   Social Determinants of Health   Financial Resource Strain: Low Risk    Difficulty of Paying Living Expenses: Not hard at all  Food Insecurity: No Food Insecurity   Worried About Charity fundraiser in the Last Year: Never true   [place] in the Last Year: Never true  Transportation Needs: No Transportation Needs   Lack of Transportation (Medical): No   Lack of Transportation (Non-Medical): No  Physical Activity: Inactive   Days of Exercise per Week: 0 days   Minutes of Exercise per Session: 0 min   Stress: No Stress Concern Present   Feeling of Stress : Not at all  Social Connections: Moderately Isolated   Frequency of Communication with Friends and Family: More than three times a week   Frequency of Social Gatherings with Friends and Family: More than three times a week   Attends Religious Services: Never   Marine scientist or Organizations: No   Attends Music therapist: Never   Marital Status: Married  Human resources officer Violence: Not At Risk   Fear of Current or Ex-Partner: No   Emotionally Abused: No   Physically Abused: No   Sexually Abused: No    Review of Systems: See HPI, otherwise negative ROS  Physical Exam: BP (!) 109/52   Pulse 77   Temp (!) 97.2 F (36.2 C) (Temporal)   Resp 18   Ht _0  (1.575 m)   Wt 74.8 kg   SpO2 99%   BMI 30.18 kg/m  General:   Alert,  pleasant and cooperative in NAD Head:  Normocephalic and atraumatic. Neck:  Supple; no masses or thyromegaly. Lungs:  Clear throughout to auscultation.    Heart:  Regular rate and rhythm. Abdomen:  Soft, nontender and nondistended. Normal bowel sounds, without guarding, and without rebound.   Neurologic:  Alert and  oriented x4;  grossly normal neurologically.  Impression/Plan: Monica Johnson is here for an endoscopy and colonoscopy to be performed for cirrhosis and screening colonoscopy   Risks, benefits, limitations, and alternatives regarding  endoscopy and colonoscopy have been reviewed with the patient.  Questions have been answered.  All parties agreeable.   [name], MD  03/07/2021, 9:14 AM

## 2021-03-07 NOTE — Transfer of Care (Signed)
Immediate Anesthesia Transfer of Care Note  Patient: Monica Johnson  Procedure(s) Performed: COLONOSCOPY WITH PROPOFOL ESOPHAGOGASTRODUODENOSCOPY (EGD) WITH PROPOFOL  Patient Location: Endoscopy Unit  Anesthesia Type:General  Level of Consciousness: awake, alert  and oriented  Airway & Oxygen Therapy: Patient Spontanous Breathing  Post-op Assessment: Report given to RN and Post -op Vital signs reviewed and stable  Post vital signs: Reviewed  Last Vitals:  Vitals Value Taken Time  BP 99/55 03/07/21 0956  Temp    Pulse 101 03/07/21 0956  Resp 16 03/07/21 0956  SpO2 98 % 03/07/21 0956  Vitals shown include unvalidated device data.  Last Pain:  Vitals:   03/07/21 0841  TempSrc: Temporal  PainSc: 6          Complications: No notable events documented.

## 2021-03-07 NOTE — Anesthesia Postprocedure Evaluation (Signed)
Anesthesia Post Note  Patient: Monica Johnson  Procedure(s) Performed: COLONOSCOPY WITH PROPOFOL ESOPHAGOGASTRODUODENOSCOPY (EGD) WITH PROPOFOL  Patient location during evaluation: Endoscopy Anesthesia Type: General Level of consciousness: awake and alert Pain management: pain level controlled Vital Signs Assessment: post-procedure vital signs reviewed and stable Respiratory status: spontaneous breathing, nonlabored ventilation, respiratory function stable and patient connected to nasal cannula oxygen Cardiovascular status: blood pressure returned to baseline and stable Postop Assessment: no apparent nausea or vomiting Anesthetic complications: no   No notable events documented.   Last Vitals:  Vitals:   03/07/21 1006 03/07/21 1017  BP: (!) 104/54 (!) 111/57  Pulse: 83 78  Resp:  11  Temp:    SpO2: 99% 99%    Last Pain:  Vitals:   03/07/21 1017  TempSrc:   PainSc: 0-No pain                 Martha Clan

## 2021-03-07 NOTE — Op Note (Signed)
[facility] Gastroenterology Patient Name: Monica Johnson Procedure Date: 03/07/2021 9:14 AM MRN: [id number] Account #: [id number] Date of Birth: 08-07-1955 Admit Type: Outpatient Age: 65 Room: [facility] ENDO ROOM 4 Gender: Female Note Status: Finalized Procedure:             Colonoscopy Indications:           Screening for colorectal malignant neoplasm Providers:             [name] MD, MD Referring MD:          Dionne Bucy. Bacigalupo (Referring MD) Medicines:             Propofol per Anesthesia Complications:         No immediate complications. Procedure:             Pre-Anesthesia Assessment:                        - Prior to the procedure, a History and Physical was                         performed, and patient medications and allergies were                         reviewed. The patient's tolerance of previous                         anesthesia was also reviewed. The risks and benefits                         of the procedure and the sedation options and risks                         were discussed with the patient. All questions were                         answered, and informed consent was obtained. Prior                         Anticoagulants: The patient has taken no previous                         anticoagulant or antiplatelet agents. ASA Grade                         Assessment: II - A patient with mild systemic disease.                         After reviewing the risks and benefits, the patient                         was deemed in satisfactory condition to undergo the                         procedure.                        After obtaining informed consent, the colonoscope was  passed under direct vision. Throughout the procedure,                         the patient's blood pressure, pulse, and oxygen                         saturations were monitored continuously. The                         Colonoscope was introduced through the  anus and                         advanced to the the cecum, identified by appendiceal                         orifice and ileocecal valve. The colonoscopy was                         performed without difficulty. The patient tolerated                         the procedure well. The quality of the bowel                         preparation was excellent. Findings:      The perianal and digital rectal examinations were normal.      A 4 mm polyp was found in the rectum. The polyp was sessile. The polyp       was removed with a cold snare. Resection and retrieval were complete.      A 4 mm polyp was found in the transverse colon. The polyp was sessile.       The polyp was removed with a cold snare. Resection and retrieval were       complete.      A 3 mm polyp was found in the descending colon. The polyp was sessile.       The polyp was removed with a cold snare. Resection and retrieval were       complete.      Multiple small-mouthed diverticula were found in the sigmoid colon.      Non-bleeding internal hemorrhoids were found during retroflexion. The       hemorrhoids were Grade II (internal hemorrhoids that prolapse but reduce       spontaneously). Impression:            - One 4 mm polyp in the rectum, removed with a cold                         snare. Resected and retrieved.                        - One 4 mm polyp in the transverse colon, removed with                         a cold snare. Resected and retrieved.                        - One 3 mm polyp in the descending colon, removed with  a cold snare. Resected and retrieved.                        - Diverticulosis in the sigmoid colon.                        - Non-bleeding internal hemorrhoids. Recommendation:        - Discharge patient to home.                        - Resume previous diet.                        - Continue present medications.                        - Await pathology results.                         - Repeat colonoscopy in 5 years for surveillance if                         adenomatous othewise 10 years. Procedure Code(s):     --- Professional ---                        [phone], Colonoscopy, flexible; with removal of                         tumor(s), polyp(s), or other lesion(s) by snare                         technique Diagnosis Code(s):     --- Professional ---                        Z12.11, Encounter for screening for malignant neoplasm                         of colon                        K62.1, Rectal polyp                        K63.5, Polyp of colon CPT copyright 2019 American Medical Association. All rights reserved. The codes documented in this report are preliminary and upon coder review may  be revised to meet current compliance requirements. [name] MD, MD 03/07/2021 9:53:08 AM This report has been signed electronically. Number of Addenda: 0 Note Initiated On: 03/07/2021 9:14 AM Scope Withdrawal Time: 0 hours 9 minutes 46 seconds  Total Procedure Duration: 0 hours 12 minutes 29 seconds  Estimated Blood Loss:  Estimated blood loss: none.      [facility]

## 2021-03-07 NOTE — Anesthesia Preprocedure Evaluation (Signed)
Anesthesia Evaluation  Patient identified by MRN, date of birth, ID band Patient awake    Reviewed: Allergy & Precautions, NPO status , Patient's Chart, lab work & pertinent test results  History of Anesthesia Complications Negative for: history of anesthetic complications  Airway Mallampati: III       Dental  (+) Upper Dentures, Dental Advidsory Given   Pulmonary neg shortness of breath, neg COPD, neg recent URI, Current Smoker and Patient abstained from smoking., former smoker,    Pulmonary exam normal        Cardiovascular Exercise Tolerance: Good hypertension, (-) angina(-) Past MI Normal cardiovascular exam(-) dysrhythmias + Valvular Problems/Murmurs      Neuro/Psych  Headaches, neg Seizures negative psych ROS   GI/Hepatic (+) Cirrhosis       ,   Endo/Other  negative endocrine ROS  Renal/GU negative Renal ROS     Musculoskeletal  (+) Arthritis , Osteoarthritis,    Abdominal Normal abdominal exam  (+)   Peds  Hematology  (+) Blood dyscrasia, anemia ,   Anesthesia Other Findings Past Medical History: No date: Anemia     Comment:  AGE 6 No date: Cervical stenosis of spine No date: Cirrhosis ([place]) No date: Headache     Comment:  H/O MIGRAINES No date: Heart murmur     Comment:  AS A CHILD- No date: Rhinitis, chronic  Reproductive/Obstetrics                             Anesthesia Physical  Anesthesia Plan  ASA: III  Anesthesia Plan: General   Post-op Pain Management:    Induction: Intravenous  PONV Risk Score and Plan: 3 and TIVA and Propofol infusion  Airway Management Planned: Natural Airway and Nasal Cannula  Additional Equipment:   Intra-op Plan:   Post-operative Plan:   Informed Consent: I have reviewed the patients History and Physical, chart, labs and discussed the procedure including the risks, benefits and alternatives for the proposed anesthesia with the  patient or authorized representative who has indicated his/her understanding and acceptance.     Dental advisory given  Plan Discussed with: CRNA and Surgeon  Anesthesia Plan Comments:         Anesthesia Quick Evaluation

## 2021-03-08 ENCOUNTER — Encounter: Payer: Self-pay | Admitting: Gastroenterology

## 2021-03-08 LAB — SURGICAL PATHOLOGY

## 2021-03-12 ENCOUNTER — Encounter: Payer: Self-pay | Admitting: Gastroenterology

## 2021-03-15 ENCOUNTER — Other Ambulatory Visit: Payer: Self-pay | Admitting: Family Medicine

## 2021-03-15 NOTE — Telephone Encounter (Signed)
Future OV 11/13/21 Approved per protocol. Requested Prescriptions  Pending Prescriptions Disp Refills  . furosemide (LASIX) 40 MG tablet [Pharmacy Med Name: FUROSEMIDE 40 MG TABLET] 90 tablet 0    Sig: TAKE 1 TABLET BY MOUTH EVERY DAY     Cardiovascular:  Diuretics - Loop Passed - 03/15/2021  4:18 PM      Passed - K in normal range and within 360 days    Potassium  Date Value Ref Range Status  06/24/2020 4.4 3.5 - 5.2 mmol/L Final         Passed - Ca in normal range and within 360 days    Calcium  Date Value Ref Range Status  06/24/2020 9.4 8.7 - 10.3 mg/dL Final         Passed - Na in normal range and within 360 days    Sodium  Date Value Ref Range Status  06/24/2020 137 134 - 144 mmol/L Final         Passed - Cr in normal range and within 360 days    Creatinine, Ser  Date Value Ref Range Status  06/24/2020 0.86 0.57 - 1.00 mg/dL Final         Passed - Last BP in normal range    BP Readings from Last 1 Encounters:  03/07/21 (!) 111/57         Passed - Valid encounter within last 6 months    Recent Outpatient Visits          4 months ago Hidradenitis suppurativa   [facility] [place], Dionne Bucy, MD   6 months ago Chronic bilateral low back pain with right-sided sciatica   [facility] [place], Dionne Bucy, MD   7 months ago Cirrhosis of liver with ascites, unspecified hepatic cirrhosis type [facility])   [facility], Dionne Bucy, MD   8 months ago Cirrhosis of liver with ascites, unspecified hepatic cirrhosis type [facility])   [facility], Dionne Bucy, MD   9 months ago Cirrhosis of liver with ascites, unspecified hepatic cirrhosis type [facility])   [place], Dionne Bucy, MD      Future Appointments            In 2 months Ralene Bathe, MD [place]

## 2021-05-24 ENCOUNTER — Ambulatory Visit: Payer: Medicare Other | Admitting: Dermatology

## 2021-08-15 ENCOUNTER — Other Ambulatory Visit: Payer: Self-pay | Admitting: Gastroenterology

## 2021-09-22 DIAGNOSIS — H5203 Hypermetropia, bilateral: Secondary | ICD-10-CM | POA: Diagnosis not present

## 2021-10-24 DIAGNOSIS — J310 Chronic rhinitis: Secondary | ICD-10-CM | POA: Diagnosis not present

## 2021-10-24 DIAGNOSIS — R5383 Other fatigue: Secondary | ICD-10-CM | POA: Diagnosis not present

## 2021-10-24 DIAGNOSIS — K828 Other specified diseases of gallbladder: Secondary | ICD-10-CM | POA: Diagnosis not present

## 2021-10-24 DIAGNOSIS — R161 Splenomegaly, not elsewhere classified: Secondary | ICD-10-CM | POA: Diagnosis not present

## 2021-10-24 DIAGNOSIS — M4802 Spinal stenosis, cervical region: Secondary | ICD-10-CM | POA: Diagnosis not present

## 2021-10-24 DIAGNOSIS — D62 Acute posthemorrhagic anemia: Secondary | ICD-10-CM | POA: Diagnosis not present

## 2021-10-24 DIAGNOSIS — K921 Melena: Secondary | ICD-10-CM | POA: Diagnosis not present

## 2021-10-24 DIAGNOSIS — K760 Fatty (change of) liver, not elsewhere classified: Secondary | ICD-10-CM | POA: Diagnosis not present

## 2021-10-24 DIAGNOSIS — F17210 Nicotine dependence, cigarettes, uncomplicated: Secondary | ICD-10-CM | POA: Diagnosis not present

## 2021-10-24 DIAGNOSIS — Z882 Allergy status to sulfonamides status: Secondary | ICD-10-CM | POA: Diagnosis not present

## 2021-10-24 DIAGNOSIS — K7460 Unspecified cirrhosis of liver: Secondary | ICD-10-CM | POA: Diagnosis not present

## 2021-10-24 DIAGNOSIS — I864 Gastric varices: Secondary | ICD-10-CM | POA: Diagnosis not present

## 2021-10-24 DIAGNOSIS — Z886 Allergy status to analgesic agent status: Secondary | ICD-10-CM | POA: Diagnosis not present

## 2021-10-24 DIAGNOSIS — D696 Thrombocytopenia, unspecified: Secondary | ICD-10-CM | POA: Diagnosis not present

## 2021-10-24 DIAGNOSIS — K5730 Diverticulosis of large intestine without perforation or abscess without bleeding: Secondary | ICD-10-CM | POA: Diagnosis not present

## 2021-10-24 DIAGNOSIS — R531 Weakness: Secondary | ICD-10-CM | POA: Diagnosis not present

## 2021-10-24 DIAGNOSIS — K922 Gastrointestinal hemorrhage, unspecified: Secondary | ICD-10-CM | POA: Diagnosis not present

## 2021-10-25 ENCOUNTER — Telehealth: Payer: Self-pay

## 2021-10-25 DIAGNOSIS — K7460 Unspecified cirrhosis of liver: Secondary | ICD-10-CM | POA: Diagnosis not present

## 2021-10-25 DIAGNOSIS — K922 Gastrointestinal hemorrhage, unspecified: Secondary | ICD-10-CM | POA: Diagnosis not present

## 2021-10-25 DIAGNOSIS — D62 Acute posthemorrhagic anemia: Secondary | ICD-10-CM | POA: Diagnosis not present

## 2021-10-25 DIAGNOSIS — J310 Chronic rhinitis: Secondary | ICD-10-CM | POA: Diagnosis not present

## 2021-10-25 DIAGNOSIS — M4802 Spinal stenosis, cervical region: Secondary | ICD-10-CM | POA: Diagnosis not present

## 2021-10-25 DIAGNOSIS — K828 Other specified diseases of gallbladder: Secondary | ICD-10-CM | POA: Diagnosis not present

## 2021-10-25 DIAGNOSIS — I864 Gastric varices: Secondary | ICD-10-CM | POA: Diagnosis not present

## 2021-10-25 DIAGNOSIS — K5730 Diverticulosis of large intestine without perforation or abscess without bleeding: Secondary | ICD-10-CM | POA: Diagnosis not present

## 2021-10-25 DIAGNOSIS — R161 Splenomegaly, not elsewhere classified: Secondary | ICD-10-CM | POA: Diagnosis not present

## 2021-10-25 NOTE — Telephone Encounter (Signed)
Patient left a message and wants Dr. Allen Norris nurse to call her back  ?

## 2021-10-26 DIAGNOSIS — K760 Fatty (change of) liver, not elsewhere classified: Secondary | ICD-10-CM | POA: Diagnosis not present

## 2021-10-26 DIAGNOSIS — Z20822 Contact with and (suspected) exposure to covid-19: Secondary | ICD-10-CM | POA: Diagnosis not present

## 2021-10-26 DIAGNOSIS — M5031 Other cervical disc degeneration,  high cervical region: Secondary | ICD-10-CM | POA: Diagnosis not present

## 2021-10-26 DIAGNOSIS — J984 Other disorders of lung: Secondary | ICD-10-CM | POA: Diagnosis not present

## 2021-10-26 DIAGNOSIS — D696 Thrombocytopenia, unspecified: Secondary | ICD-10-CM | POA: Diagnosis not present

## 2021-10-26 DIAGNOSIS — I864 Gastric varices: Secondary | ICD-10-CM | POA: Diagnosis not present

## 2021-10-26 DIAGNOSIS — G8929 Other chronic pain: Secondary | ICD-10-CM | POA: Diagnosis not present

## 2021-10-26 DIAGNOSIS — K7469 Other cirrhosis of liver: Secondary | ICD-10-CM | POA: Diagnosis not present

## 2021-10-26 DIAGNOSIS — K3189 Other diseases of stomach and duodenum: Secondary | ICD-10-CM | POA: Diagnosis not present

## 2021-10-26 DIAGNOSIS — M50321 Other cervical disc degeneration at C4-C5 level: Secondary | ICD-10-CM | POA: Diagnosis not present

## 2021-10-26 DIAGNOSIS — I8500 Esophageal varices without bleeding: Secondary | ICD-10-CM | POA: Diagnosis not present

## 2021-10-26 DIAGNOSIS — I8510 Secondary esophageal varices without bleeding: Secondary | ICD-10-CM | POA: Diagnosis not present

## 2021-10-26 DIAGNOSIS — D62 Acute posthemorrhagic anemia: Secondary | ICD-10-CM | POA: Diagnosis not present

## 2021-10-26 DIAGNOSIS — Z882 Allergy status to sulfonamides status: Secondary | ICD-10-CM | POA: Diagnosis not present

## 2021-10-26 DIAGNOSIS — K922 Gastrointestinal hemorrhage, unspecified: Secondary | ICD-10-CM | POA: Diagnosis not present

## 2021-10-26 DIAGNOSIS — M4802 Spinal stenosis, cervical region: Secondary | ICD-10-CM | POA: Diagnosis not present

## 2021-10-26 DIAGNOSIS — K921 Melena: Secondary | ICD-10-CM | POA: Diagnosis not present

## 2021-10-26 DIAGNOSIS — Z886 Allergy status to analgesic agent status: Secondary | ICD-10-CM | POA: Diagnosis not present

## 2021-10-26 DIAGNOSIS — R918 Other nonspecific abnormal finding of lung field: Secondary | ICD-10-CM | POA: Diagnosis not present

## 2021-10-26 DIAGNOSIS — K7581 Nonalcoholic steatohepatitis (NASH): Secondary | ICD-10-CM | POA: Diagnosis not present

## 2021-10-26 DIAGNOSIS — K219 Gastro-esophageal reflux disease without esophagitis: Secondary | ICD-10-CM | POA: Diagnosis not present

## 2021-10-26 DIAGNOSIS — F17210 Nicotine dependence, cigarettes, uncomplicated: Secondary | ICD-10-CM | POA: Diagnosis not present

## 2021-10-26 DIAGNOSIS — J90 Pleural effusion, not elsewhere classified: Secondary | ICD-10-CM | POA: Diagnosis not present

## 2021-10-26 DIAGNOSIS — I952 Hypotension due to drugs: Secondary | ICD-10-CM | POA: Diagnosis not present

## 2021-10-26 DIAGNOSIS — K766 Portal hypertension: Secondary | ICD-10-CM | POA: Diagnosis not present

## 2021-10-26 DIAGNOSIS — K7460 Unspecified cirrhosis of liver: Secondary | ICD-10-CM | POA: Diagnosis not present

## 2021-11-06 NOTE — Telephone Encounter (Signed)
Pt just had questions about her care after hospital admission and states she will be moving her care to [facility].... Pt reports she is feeling better overall and has an upcoming CPE with PCP ?

## 2021-11-07 DIAGNOSIS — D696 Thrombocytopenia, unspecified: Secondary | ICD-10-CM | POA: Diagnosis not present

## 2021-11-07 DIAGNOSIS — F17200 Nicotine dependence, unspecified, uncomplicated: Secondary | ICD-10-CM | POA: Diagnosis not present

## 2021-11-07 DIAGNOSIS — J189 Pneumonia, unspecified organism: Secondary | ICD-10-CM | POA: Diagnosis not present

## 2021-11-07 DIAGNOSIS — K922 Gastrointestinal hemorrhage, unspecified: Secondary | ICD-10-CM | POA: Diagnosis not present

## 2021-11-07 DIAGNOSIS — K5730 Diverticulosis of large intestine without perforation or abscess without bleeding: Secondary | ICD-10-CM | POA: Diagnosis not present

## 2021-11-07 DIAGNOSIS — K921 Melena: Secondary | ICD-10-CM | POA: Diagnosis not present

## 2021-11-07 DIAGNOSIS — R188 Other ascites: Secondary | ICD-10-CM | POA: Diagnosis not present

## 2021-11-07 DIAGNOSIS — J168 Pneumonia due to other specified infectious organisms: Secondary | ICD-10-CM | POA: Diagnosis not present

## 2021-11-07 DIAGNOSIS — Z886 Allergy status to analgesic agent status: Secondary | ICD-10-CM | POA: Diagnosis not present

## 2021-11-07 DIAGNOSIS — K7460 Unspecified cirrhosis of liver: Secondary | ICD-10-CM | POA: Diagnosis not present

## 2021-11-07 DIAGNOSIS — Z882 Allergy status to sulfonamides status: Secondary | ICD-10-CM | POA: Diagnosis not present

## 2021-11-07 DIAGNOSIS — R161 Splenomegaly, not elsewhere classified: Secondary | ICD-10-CM | POA: Diagnosis not present

## 2021-11-07 DIAGNOSIS — B1710 Acute hepatitis C without hepatic coma: Secondary | ICD-10-CM | POA: Diagnosis not present

## 2021-11-07 DIAGNOSIS — R1012 Left upper quadrant pain: Secondary | ICD-10-CM | POA: Diagnosis not present

## 2021-11-07 DIAGNOSIS — F17210 Nicotine dependence, cigarettes, uncomplicated: Secondary | ICD-10-CM | POA: Diagnosis not present

## 2021-11-07 DIAGNOSIS — K7290 Hepatic failure, unspecified without coma: Secondary | ICD-10-CM | POA: Diagnosis not present

## 2021-11-08 DIAGNOSIS — K922 Gastrointestinal hemorrhage, unspecified: Secondary | ICD-10-CM | POA: Diagnosis not present

## 2021-11-08 DIAGNOSIS — K921 Melena: Secondary | ICD-10-CM | POA: Diagnosis not present

## 2021-11-08 DIAGNOSIS — K7460 Unspecified cirrhosis of liver: Secondary | ICD-10-CM | POA: Diagnosis not present

## 2021-11-08 DIAGNOSIS — F17200 Nicotine dependence, unspecified, uncomplicated: Secondary | ICD-10-CM | POA: Diagnosis not present

## 2021-11-08 DIAGNOSIS — K7290 Hepatic failure, unspecified without coma: Secondary | ICD-10-CM | POA: Diagnosis not present

## 2021-11-08 DIAGNOSIS — J189 Pneumonia, unspecified organism: Secondary | ICD-10-CM | POA: Diagnosis not present

## 2021-11-08 DIAGNOSIS — D696 Thrombocytopenia, unspecified: Secondary | ICD-10-CM | POA: Diagnosis not present

## 2021-11-08 DIAGNOSIS — B1710 Acute hepatitis C without hepatic coma: Secondary | ICD-10-CM | POA: Diagnosis not present

## 2021-11-09 DIAGNOSIS — K922 Gastrointestinal hemorrhage, unspecified: Secondary | ICD-10-CM | POA: Diagnosis not present

## 2021-11-09 DIAGNOSIS — B1710 Acute hepatitis C without hepatic coma: Secondary | ICD-10-CM | POA: Diagnosis not present

## 2021-11-09 DIAGNOSIS — D696 Thrombocytopenia, unspecified: Secondary | ICD-10-CM | POA: Diagnosis not present

## 2021-11-09 DIAGNOSIS — F17200 Nicotine dependence, unspecified, uncomplicated: Secondary | ICD-10-CM | POA: Diagnosis not present

## 2021-11-09 DIAGNOSIS — K921 Melena: Secondary | ICD-10-CM | POA: Diagnosis not present

## 2021-11-09 DIAGNOSIS — K7290 Hepatic failure, unspecified without coma: Secondary | ICD-10-CM | POA: Diagnosis not present

## 2021-11-09 DIAGNOSIS — K7460 Unspecified cirrhosis of liver: Secondary | ICD-10-CM | POA: Diagnosis not present

## 2021-11-09 DIAGNOSIS — J189 Pneumonia, unspecified organism: Secondary | ICD-10-CM | POA: Diagnosis not present

## 2021-11-13 ENCOUNTER — Encounter: Payer: Medicare Other | Admitting: Family Medicine

## 2021-11-20 DIAGNOSIS — K7460 Unspecified cirrhosis of liver: Secondary | ICD-10-CM | POA: Diagnosis not present

## 2021-11-20 DIAGNOSIS — K7290 Hepatic failure, unspecified without coma: Secondary | ICD-10-CM | POA: Diagnosis not present

## 2021-11-30 DIAGNOSIS — E786 Lipoprotein deficiency: Secondary | ICD-10-CM | POA: Diagnosis not present

## 2021-11-30 DIAGNOSIS — K7460 Unspecified cirrhosis of liver: Secondary | ICD-10-CM | POA: Diagnosis not present

## 2021-11-30 DIAGNOSIS — D696 Thrombocytopenia, unspecified: Secondary | ICD-10-CM | POA: Diagnosis not present

## 2021-11-30 DIAGNOSIS — I864 Gastric varices: Secondary | ICD-10-CM | POA: Diagnosis not present

## 2021-11-30 DIAGNOSIS — K5730 Diverticulosis of large intestine without perforation or abscess without bleeding: Secondary | ICD-10-CM | POA: Diagnosis not present

## 2021-11-30 DIAGNOSIS — M4802 Spinal stenosis, cervical region: Secondary | ICD-10-CM | POA: Diagnosis not present

## 2021-11-30 DIAGNOSIS — F17200 Nicotine dependence, unspecified, uncomplicated: Secondary | ICD-10-CM | POA: Diagnosis not present

## 2021-11-30 DIAGNOSIS — I700 Atherosclerosis of aorta: Secondary | ICD-10-CM | POA: Diagnosis not present

## 2021-11-30 DIAGNOSIS — I8510 Secondary esophageal varices without bleeding: Secondary | ICD-10-CM | POA: Diagnosis not present

## 2021-11-30 DIAGNOSIS — I2510 Atherosclerotic heart disease of native coronary artery without angina pectoris: Secondary | ICD-10-CM | POA: Diagnosis not present

## 2021-11-30 DIAGNOSIS — K7290 Hepatic failure, unspecified without coma: Secondary | ICD-10-CM | POA: Diagnosis not present

## 2021-11-30 DIAGNOSIS — J432 Centrilobular emphysema: Secondary | ICD-10-CM | POA: Diagnosis not present

## 2021-12-29 DIAGNOSIS — I2510 Atherosclerotic heart disease of native coronary artery without angina pectoris: Secondary | ICD-10-CM | POA: Diagnosis not present

## 2021-12-29 DIAGNOSIS — R9431 Abnormal electrocardiogram [ECG] [EKG]: Secondary | ICD-10-CM | POA: Diagnosis not present

## 2021-12-29 DIAGNOSIS — R0602 Shortness of breath: Secondary | ICD-10-CM | POA: Diagnosis not present

## 2022-01-05 DIAGNOSIS — R0602 Shortness of breath: Secondary | ICD-10-CM | POA: Diagnosis not present

## 2022-01-05 DIAGNOSIS — I2510 Atherosclerotic heart disease of native coronary artery without angina pectoris: Secondary | ICD-10-CM | POA: Diagnosis not present

## 2022-01-05 DIAGNOSIS — R9431 Abnormal electrocardiogram [ECG] [EKG]: Secondary | ICD-10-CM | POA: Diagnosis not present

## 2022-02-25 IMAGING — MR MR ABDOMEN WO/W CM
17 series · 47 of 48 positions shown · IV contrast (gadavist)
Comparison: Right upper quadrant ultrasound dated 05/15/2019

CLINICAL DATA: Cirrhosis, possible liver lesion on ultrasound

EXAM:
MRI ABDOMEN WITHOUT AND WITH CONTRAST
TECHNIQUE: Multiplanar multisequence MR imaging of the abdomen was performed
both before and after the administration of intravenous contrast.
CONTRAST:  7.5mL GADAVIST GADOBUTROL 1 MMOL/ML IV SOLN

[Series 3: T2 · coronal · 6.0mm · 1.19mm/px · 2 of 30 slices shown (1 of 2)]
[im 1/30]
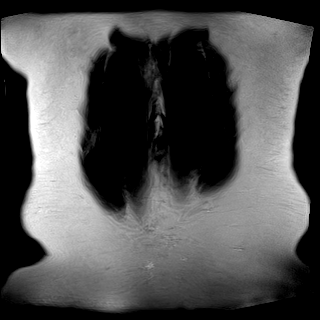
[im 30/30]
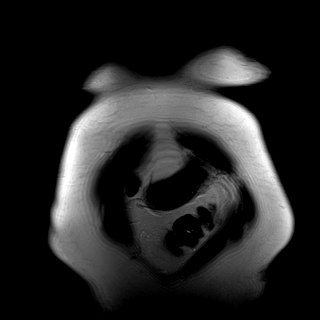

[Series 4: T2 · axial · 6.0mm · 1.19mm/px · z∈[-131,+121]mm · 3 of 36 slices shown (2 of 2)]
[im 1/36]
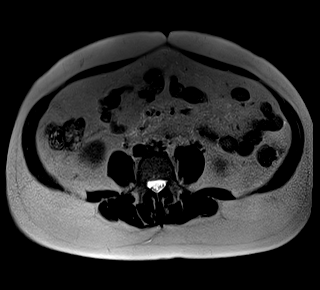
[im 18/36]
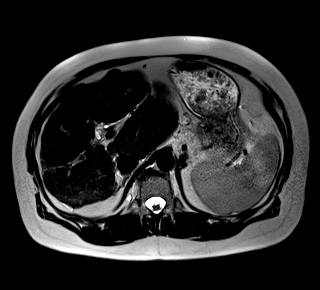
[im 36/36]
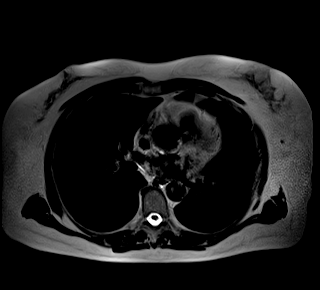

[Series 6: T2 fat-sat · axial · 6.0mm · 1.19mm/px · z∈[-111,+126]mm · 2 of 34 slices shown]
[im 1/34]
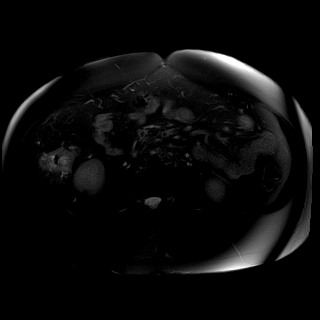
[im 34/34]
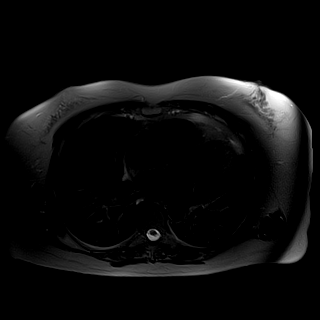

[Series 7: ax dwi_tracew · axial · 6.0mm · 1.42mm/px · z∈[-111,+126]mm · 5 of 102 slices shown]
[im 1/102]
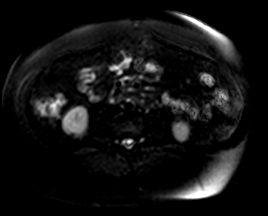
[im 26/102]
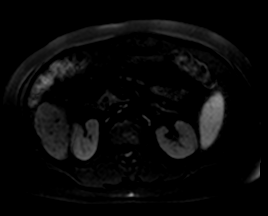
[im 51/102]
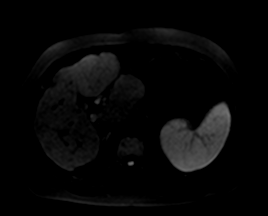
[im 76/102]
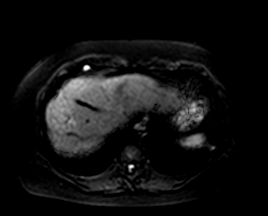
[im 102/102]
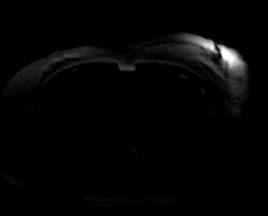

[Series 8: ax dwi_adc · axial · 6.0mm · 1.42mm/px · 1 of 34 slices shown]
[im 1/34]
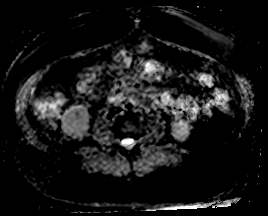

[Series 9: T1 · axial · 6.0mm · 0.74mm/px · z∈[-131,+121]mm · 3 of 72 slices shown]
[im 1/72]
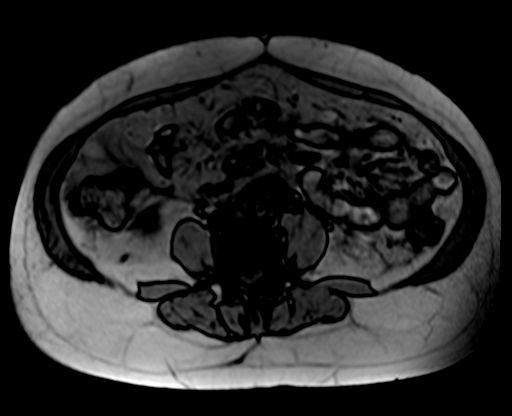
[im 36/72]
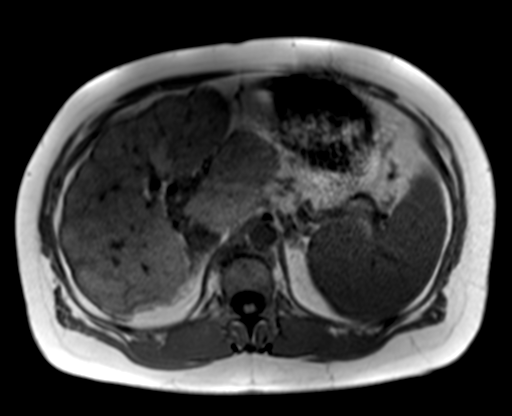
[im 72/72]
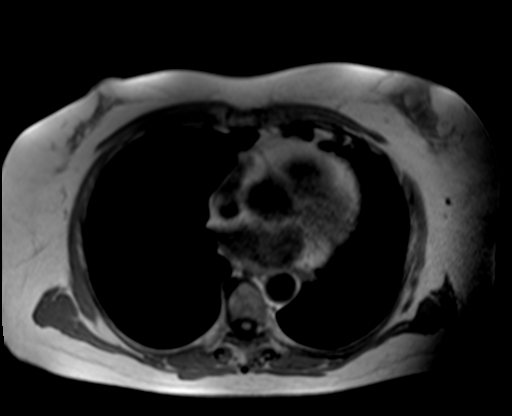

[Series 10: bSSFP · axial · 6.0mm · 0.74mm/px · z∈[-131,+121]mm · 2 of 36 slices shown]
[im 1/36]
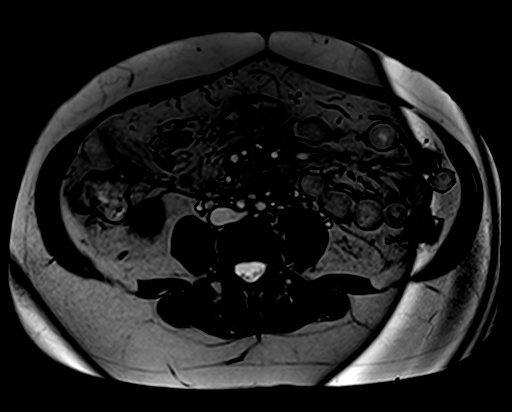
[im 36/36]
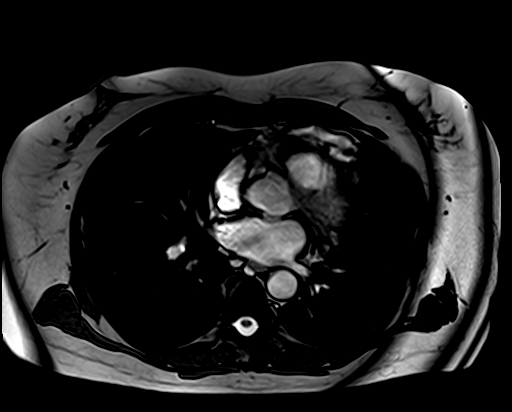

[Series 11: T1 dynamic fat-sat · axial · non-contrast · 3.2mm · 1.19mm/px · z∈[-132,+121]mm · 3 of 80 slices shown (1 of 5)]
[im 1/80]
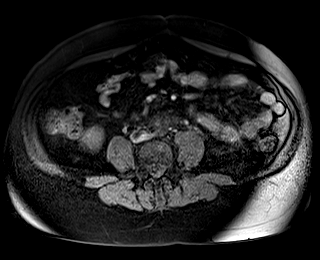
[im 40/80]
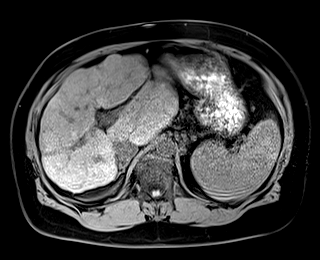
[im 80/80]
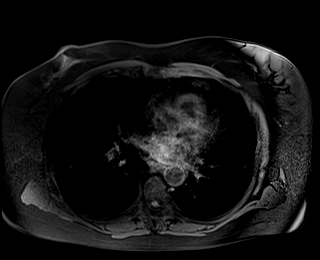

[Series 12: T1 dynamic fat-sat post-contrast · axial · 3.2mm · 1.19mm/px · z∈[-132,+121]mm · 3 of 80 slices shown (1 of 4)]
[im 1/80]
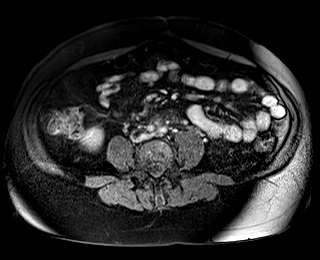
[im 40/80]
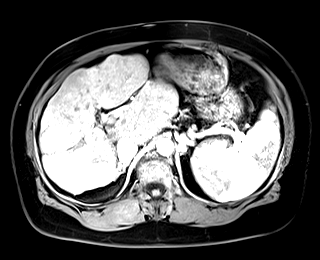
[im 80/80]
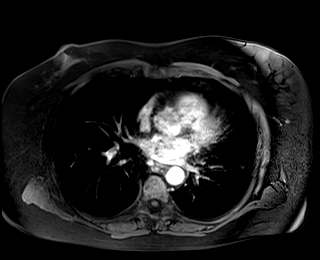

[Series 13: T1 dynamic fat-sat · axial · 3.2mm · 1.19mm/px · z∈[-132,+121]mm · 3 of 80 slices shown (2 of 5)]
[im 1/80]
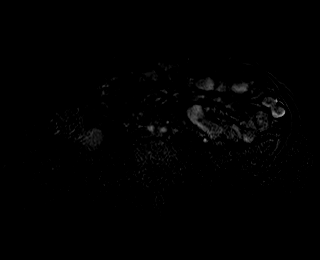
[im 40/80]
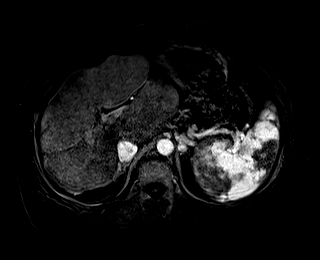
[im 80/80]
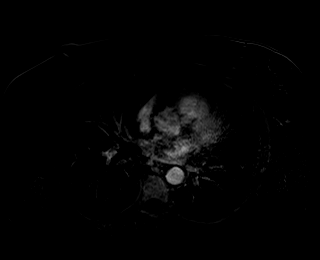

[Series 14: T1 dynamic fat-sat post-contrast · axial · 3.2mm · 1.19mm/px · z∈[-132,+121]mm · 3 of 80 slices shown (2 of 4)]
[im 1/80]
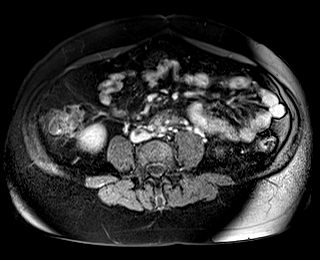
[im 40/80]
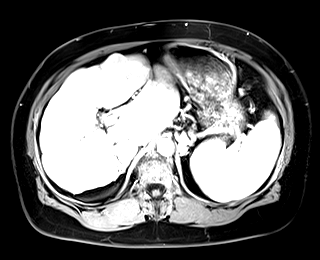
[im 80/80]
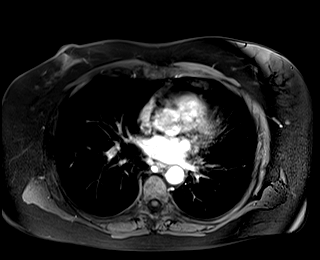

[Series 15: T1 dynamic fat-sat · axial · 3.2mm · 1.19mm/px · z∈[-132,+121]mm · 3 of 80 slices shown (3 of 5)]
[im 1/80]
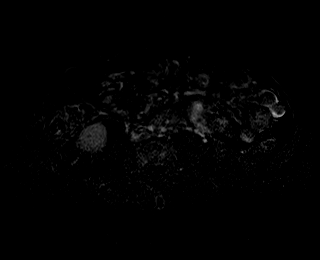
[im 40/80]
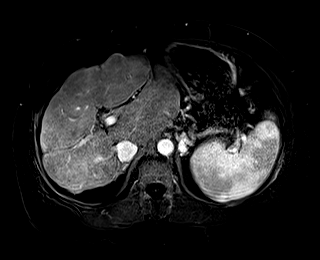
[im 80/80]
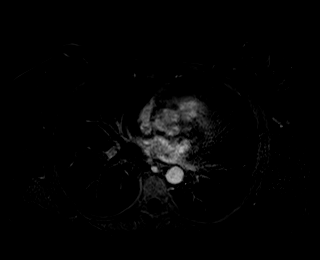

[Series 16: T1 dynamic fat-sat post-contrast · axial · 3.2mm · 1.19mm/px · z∈[-132,+121]mm · 3 of 80 slices shown (3 of 4)]
[im 1/80]
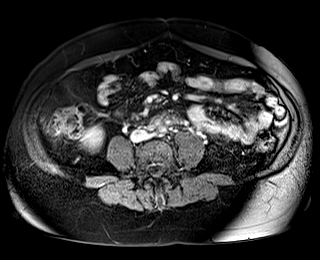
[im 40/80]
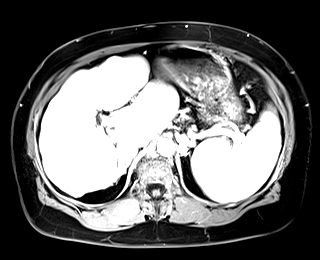
[im 80/80]
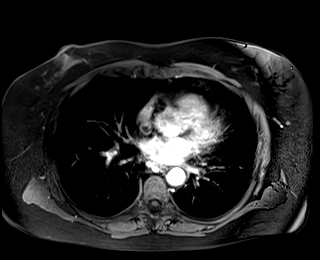

[Series 17: T1 dynamic fat-sat · axial · 3.2mm · 1.19mm/px · z∈[-132,+121]mm · 3 of 80 slices shown (4 of 5)]
[im 1/80]
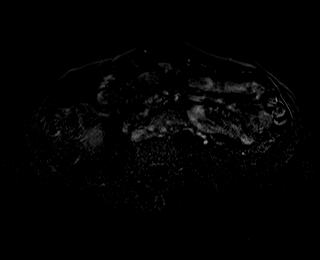
[im 40/80]
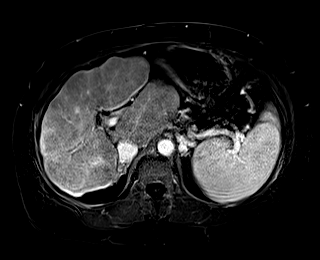
[im 80/80]
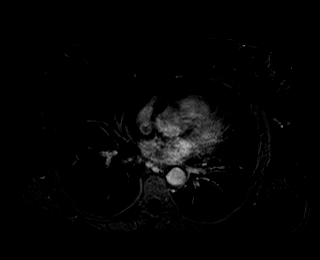

[Series 18: T1 dynamic post-contrast · coronal · 3.0mm · 1.31mm/px · 3 of 72 slices shown]
[im 1/72]
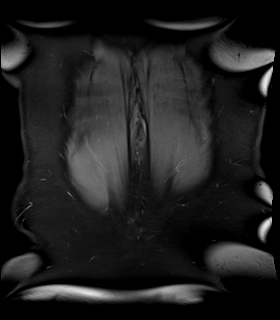
[im 36/72]
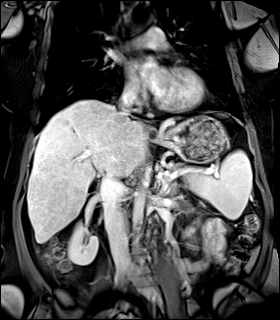
[im 72/72]
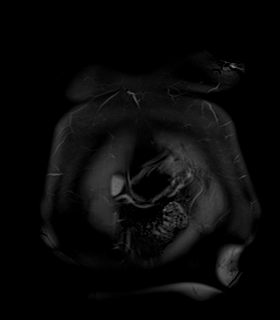

[Series 19: T1 dynamic fat-sat post-contrast · axial · 3.2mm · 1.19mm/px · z∈[-132,+121]mm · 3 of 80 slices shown (4 of 4)]
[im 1/80]
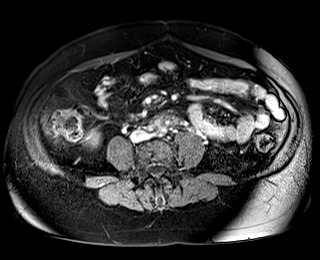
[im 40/80]
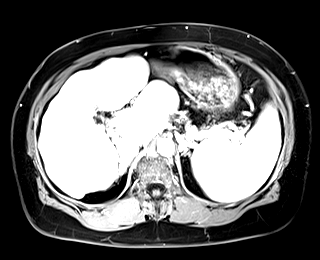
[im 80/80]
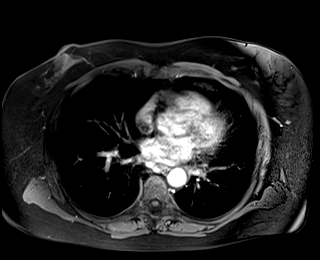

[Series 20: T1 dynamic fat-sat · axial · 3.2mm · 1.19mm/px · z∈[-132,-7]mm · 2 of 80 slices shown (5 of 5)]
[im 1/80]
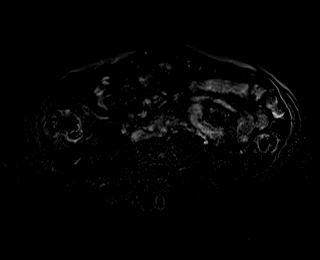
[im 40/80]
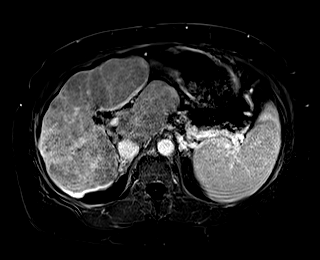

[47 of 48 positions shown; findings below may reference images not displayed]

FINDINGS: Lower chest: Lung bases are clear.

Hepatobiliary: Cirrhosis. No hepatic steatosis. Two subcentimeter T2
hyperintense nodules in the right hepatic dome (series 11/images 21
and 23), reflecting regenerating nodules. No suspicious/enhancing
lesions to suggest HCC. Specifically, no lesion in the left hepatic
lobe to correspond to the potential sonographic abnormality.
Scarring in the posterior right hepatic lobe without underlying mass
lesion.

Layering small gallstones (series 4/image 22), without associated
inflammatory changes. No intrahepatic or extrahepatic ductal
dilatation.

Pancreas:  Within normal limits.

Spleen: Mild splenomegaly, measuring 13.6 cm maximal craniocaudal
dimension.

Adrenals/Urinary Tract:  Adrenal glands are within normal limits.

Kidneys are within normal limits.  No hydronephrosis.

Stomach/Bowel: Stomach and visualized bowel are within normal
limits.

Vascular/Lymphatic:  No evidence of abdominal aortic aneurysm.

Portal vein is patent. Gastroesophageal varices with dominant
perigastric varices along the posterior gastric cardia.

Small upper abdominal lymph nodes, likely reactive.

Other:  No abdominal ascites.

Musculoskeletal: No focal osseous lesions.
IMPRESSION: Cirrhosis.  No findings suspicious for HCC.

Portal vein is patent. Borderline splenomegaly. Gastroesophageal
varices with dominant perigastric varices along the posterior
gastric cardia.

Cholelithiasis, without associated inflammatory changes.

## 2022-03-09 DIAGNOSIS — L989 Disorder of the skin and subcutaneous tissue, unspecified: Secondary | ICD-10-CM | POA: Diagnosis not present

## 2022-03-09 DIAGNOSIS — R911 Solitary pulmonary nodule: Secondary | ICD-10-CM | POA: Diagnosis not present

## 2022-03-09 DIAGNOSIS — I864 Gastric varices: Secondary | ICD-10-CM | POA: Diagnosis not present

## 2022-03-09 DIAGNOSIS — K7460 Unspecified cirrhosis of liver: Secondary | ICD-10-CM | POA: Diagnosis not present

## 2022-03-09 DIAGNOSIS — K5730 Diverticulosis of large intestine without perforation or abscess without bleeding: Secondary | ICD-10-CM | POA: Diagnosis not present

## 2022-03-09 DIAGNOSIS — J432 Centrilobular emphysema: Secondary | ICD-10-CM | POA: Diagnosis not present

## 2022-03-09 DIAGNOSIS — Z0000 Encounter for general adult medical examination without abnormal findings: Secondary | ICD-10-CM | POA: Diagnosis not present

## 2022-03-09 DIAGNOSIS — F17200 Nicotine dependence, unspecified, uncomplicated: Secondary | ICD-10-CM | POA: Diagnosis not present

## 2022-03-09 DIAGNOSIS — I8510 Secondary esophageal varices without bleeding: Secondary | ICD-10-CM | POA: Diagnosis not present

## 2022-03-09 DIAGNOSIS — M5136 Other intervertebral disc degeneration, lumbar region: Secondary | ICD-10-CM | POA: Diagnosis not present

## 2022-03-09 DIAGNOSIS — D696 Thrombocytopenia, unspecified: Secondary | ICD-10-CM | POA: Diagnosis not present

## 2022-03-09 DIAGNOSIS — I700 Atherosclerosis of aorta: Secondary | ICD-10-CM | POA: Diagnosis not present

## 2022-05-02 DIAGNOSIS — K7460 Unspecified cirrhosis of liver: Secondary | ICD-10-CM | POA: Diagnosis not present

## 2022-05-02 DIAGNOSIS — D696 Thrombocytopenia, unspecified: Secondary | ICD-10-CM | POA: Diagnosis not present

## 2022-05-22 DIAGNOSIS — K7290 Hepatic failure, unspecified without coma: Secondary | ICD-10-CM | POA: Diagnosis not present

## 2022-05-22 DIAGNOSIS — D696 Thrombocytopenia, unspecified: Secondary | ICD-10-CM | POA: Diagnosis not present

## 2022-05-22 DIAGNOSIS — K7460 Unspecified cirrhosis of liver: Secondary | ICD-10-CM | POA: Diagnosis not present

## 2022-08-22 DIAGNOSIS — D696 Thrombocytopenia, unspecified: Secondary | ICD-10-CM | POA: Diagnosis not present

## 2022-08-22 DIAGNOSIS — K7290 Hepatic failure, unspecified without coma: Secondary | ICD-10-CM | POA: Diagnosis not present

## 2022-08-22 DIAGNOSIS — K7460 Unspecified cirrhosis of liver: Secondary | ICD-10-CM | POA: Diagnosis not present

## 2022-08-27 ENCOUNTER — Other Ambulatory Visit: Payer: Self-pay

## 2022-08-27 DIAGNOSIS — I700 Atherosclerosis of aorta: Secondary | ICD-10-CM

## 2022-08-27 DIAGNOSIS — K766 Portal hypertension: Secondary | ICD-10-CM

## 2022-08-27 DIAGNOSIS — R188 Other ascites: Secondary | ICD-10-CM

## 2022-09-10 DIAGNOSIS — J432 Centrilobular emphysema: Secondary | ICD-10-CM | POA: Diagnosis not present

## 2022-09-10 DIAGNOSIS — D696 Thrombocytopenia, unspecified: Secondary | ICD-10-CM | POA: Diagnosis not present

## 2022-09-10 DIAGNOSIS — K7460 Unspecified cirrhosis of liver: Secondary | ICD-10-CM | POA: Diagnosis not present

## 2022-09-10 DIAGNOSIS — I700 Atherosclerosis of aorta: Secondary | ICD-10-CM | POA: Diagnosis not present

## 2022-09-10 DIAGNOSIS — G8929 Other chronic pain: Secondary | ICD-10-CM | POA: Diagnosis not present

## 2022-09-10 DIAGNOSIS — R911 Solitary pulmonary nodule: Secondary | ICD-10-CM | POA: Diagnosis not present

## 2022-09-10 DIAGNOSIS — K7290 Hepatic failure, unspecified without coma: Secondary | ICD-10-CM | POA: Diagnosis not present

## 2022-09-10 DIAGNOSIS — M545 Low back pain, unspecified: Secondary | ICD-10-CM | POA: Diagnosis not present

## 2022-09-21 DIAGNOSIS — K7290 Hepatic failure, unspecified without coma: Secondary | ICD-10-CM | POA: Diagnosis not present

## 2022-09-21 DIAGNOSIS — K7460 Unspecified cirrhosis of liver: Secondary | ICD-10-CM | POA: Diagnosis not present

## 2022-09-21 DIAGNOSIS — I864 Gastric varices: Secondary | ICD-10-CM | POA: Diagnosis not present

## 2022-12-25 DIAGNOSIS — D696 Thrombocytopenia, unspecified: Secondary | ICD-10-CM | POA: Diagnosis not present

## 2022-12-25 DIAGNOSIS — K7290 Hepatic failure, unspecified without coma: Secondary | ICD-10-CM | POA: Diagnosis not present

## 2022-12-25 DIAGNOSIS — K7460 Unspecified cirrhosis of liver: Secondary | ICD-10-CM | POA: Diagnosis not present

## 2023-03-12 DIAGNOSIS — R591 Generalized enlarged lymph nodes: Secondary | ICD-10-CM | POA: Diagnosis not present

## 2023-03-12 DIAGNOSIS — R42 Dizziness and giddiness: Secondary | ICD-10-CM | POA: Diagnosis not present

## 2023-03-12 DIAGNOSIS — Z882 Allergy status to sulfonamides status: Secondary | ICD-10-CM | POA: Diagnosis not present

## 2023-03-12 DIAGNOSIS — D696 Thrombocytopenia, unspecified: Secondary | ICD-10-CM | POA: Diagnosis not present

## 2023-03-12 DIAGNOSIS — I959 Hypotension, unspecified: Secondary | ICD-10-CM | POA: Diagnosis not present

## 2023-03-12 DIAGNOSIS — Z888 Allergy status to other drugs, medicaments and biological substances status: Secondary | ICD-10-CM | POA: Diagnosis not present

## 2023-03-12 DIAGNOSIS — R55 Syncope and collapse: Secondary | ICD-10-CM | POA: Diagnosis not present

## 2023-03-12 DIAGNOSIS — R9431 Abnormal electrocardiogram [ECG] [EKG]: Secondary | ICD-10-CM | POA: Diagnosis not present

## 2023-04-30 DIAGNOSIS — D696 Thrombocytopenia, unspecified: Secondary | ICD-10-CM | POA: Diagnosis not present

## 2023-04-30 DIAGNOSIS — K7460 Unspecified cirrhosis of liver: Secondary | ICD-10-CM | POA: Diagnosis not present

## 2023-08-13 DIAGNOSIS — L989 Disorder of the skin and subcutaneous tissue, unspecified: Secondary | ICD-10-CM | POA: Diagnosis not present

## 2023-08-13 DIAGNOSIS — K7460 Unspecified cirrhosis of liver: Secondary | ICD-10-CM | POA: Diagnosis not present

## 2023-08-13 DIAGNOSIS — Z0000 Encounter for general adult medical examination without abnormal findings: Secondary | ICD-10-CM | POA: Diagnosis not present

## 2023-08-13 DIAGNOSIS — D126 Benign neoplasm of colon, unspecified: Secondary | ICD-10-CM | POA: Diagnosis not present

## 2023-08-13 DIAGNOSIS — I8510 Secondary esophageal varices without bleeding: Secondary | ICD-10-CM | POA: Diagnosis not present

## 2023-08-13 DIAGNOSIS — I700 Atherosclerosis of aorta: Secondary | ICD-10-CM | POA: Diagnosis not present

## 2023-08-13 DIAGNOSIS — Z1330 Encounter for screening examination for mental health and behavioral disorders, unspecified: Secondary | ICD-10-CM | POA: Diagnosis not present

## 2023-08-13 DIAGNOSIS — K7290 Hepatic failure, unspecified without coma: Secondary | ICD-10-CM | POA: Diagnosis not present

## 2023-08-13 DIAGNOSIS — R591 Generalized enlarged lymph nodes: Secondary | ICD-10-CM | POA: Diagnosis not present

## 2023-08-13 DIAGNOSIS — I864 Gastric varices: Secondary | ICD-10-CM | POA: Diagnosis not present

## 2023-08-13 DIAGNOSIS — J432 Centrilobular emphysema: Secondary | ICD-10-CM | POA: Diagnosis not present

## 2023-08-13 DIAGNOSIS — D696 Thrombocytopenia, unspecified: Secondary | ICD-10-CM | POA: Diagnosis not present

## 2023-08-13 DIAGNOSIS — Z5309 Procedure and treatment not carried out because of other contraindication: Secondary | ICD-10-CM | POA: Diagnosis not present

## 2023-09-02 DIAGNOSIS — K7460 Unspecified cirrhosis of liver: Secondary | ICD-10-CM | POA: Diagnosis not present

## 2023-09-02 DIAGNOSIS — D696 Thrombocytopenia, unspecified: Secondary | ICD-10-CM | POA: Diagnosis not present

## 2023-09-04 DIAGNOSIS — K7460 Unspecified cirrhosis of liver: Secondary | ICD-10-CM | POA: Diagnosis not present

## 2023-09-04 DIAGNOSIS — K7290 Hepatic failure, unspecified without coma: Secondary | ICD-10-CM | POA: Diagnosis not present

## 2023-09-04 DIAGNOSIS — I864 Gastric varices: Secondary | ICD-10-CM | POA: Diagnosis not present

## 2023-09-25 DIAGNOSIS — D2262 Melanocytic nevi of left upper limb, including shoulder: Secondary | ICD-10-CM | POA: Diagnosis not present

## 2023-09-25 DIAGNOSIS — L820 Inflamed seborrheic keratosis: Secondary | ICD-10-CM | POA: Diagnosis not present

## 2023-09-25 DIAGNOSIS — D485 Neoplasm of uncertain behavior of skin: Secondary | ICD-10-CM | POA: Diagnosis not present

## 2023-09-25 DIAGNOSIS — L814 Other melanin hyperpigmentation: Secondary | ICD-10-CM | POA: Diagnosis not present

## 2023-10-01 DIAGNOSIS — I2584 Coronary atherosclerosis due to calcified coronary lesion: Secondary | ICD-10-CM | POA: Diagnosis not present

## 2023-10-01 DIAGNOSIS — K7460 Unspecified cirrhosis of liver: Secondary | ICD-10-CM | POA: Diagnosis not present

## 2023-10-01 DIAGNOSIS — R590 Localized enlarged lymph nodes: Secondary | ICD-10-CM | POA: Diagnosis not present

## 2023-10-01 DIAGNOSIS — I2510 Atherosclerotic heart disease of native coronary artery without angina pectoris: Secondary | ICD-10-CM | POA: Diagnosis not present

## 2023-10-01 DIAGNOSIS — Z9049 Acquired absence of other specified parts of digestive tract: Secondary | ICD-10-CM | POA: Diagnosis not present

## 2023-10-01 DIAGNOSIS — K7290 Hepatic failure, unspecified without coma: Secondary | ICD-10-CM | POA: Diagnosis not present

## 2023-10-01 DIAGNOSIS — R591 Generalized enlarged lymph nodes: Secondary | ICD-10-CM | POA: Diagnosis not present

## 2023-10-04 DIAGNOSIS — I8500 Esophageal varices without bleeding: Secondary | ICD-10-CM | POA: Diagnosis not present

## 2023-10-04 DIAGNOSIS — I8510 Secondary esophageal varices without bleeding: Secondary | ICD-10-CM | POA: Diagnosis not present

## 2023-10-04 DIAGNOSIS — K3189 Other diseases of stomach and duodenum: Secondary | ICD-10-CM | POA: Diagnosis not present

## 2023-10-04 DIAGNOSIS — K7460 Unspecified cirrhosis of liver: Secondary | ICD-10-CM | POA: Diagnosis not present

## 2024-02-21 ENCOUNTER — Encounter: Payer: Self-pay | Admitting: Advanced Practice Midwife

## 2024-07-01 DIAGNOSIS — M51369 Other intervertebral disc degeneration, lumbar region without mention of lumbar back pain or lower extremity pain: Secondary | ICD-10-CM | POA: Diagnosis not present

## 2024-07-01 DIAGNOSIS — K7290 Hepatic failure, unspecified without coma: Secondary | ICD-10-CM | POA: Diagnosis not present

## 2024-07-01 DIAGNOSIS — M4802 Spinal stenosis, cervical region: Secondary | ICD-10-CM | POA: Diagnosis not present

## 2024-07-01 DIAGNOSIS — D696 Thrombocytopenia, unspecified: Secondary | ICD-10-CM | POA: Diagnosis not present

## 2024-07-01 DIAGNOSIS — K7460 Unspecified cirrhosis of liver: Secondary | ICD-10-CM | POA: Diagnosis not present
